# Patient Record
Sex: Female | Born: 1980 | Race: Black or African American | Hispanic: No | Marital: Single | Smoking: Never smoker
Health system: Southern US, Community
[De-identification: ages and names within clinical notes are randomized; demographics above are authoritative.]

## PROBLEM LIST (undated history)

## (undated) ENCOUNTER — Inpatient Hospital Stay (HOSPITAL_COMMUNITY): Payer: Self-pay

## (undated) DIAGNOSIS — R87629 Unspecified abnormal cytological findings in specimens from vagina: Secondary | ICD-10-CM

## (undated) DIAGNOSIS — Z8619 Personal history of other infectious and parasitic diseases: Secondary | ICD-10-CM

## (undated) DIAGNOSIS — D649 Anemia, unspecified: Secondary | ICD-10-CM

## (undated) DIAGNOSIS — Z5189 Encounter for other specified aftercare: Secondary | ICD-10-CM

## (undated) HISTORY — PX: COLPOSCOPY: SHX161

## (undated) HISTORY — PX: ADENOIDECTOMY: SUR15

## (undated) HISTORY — PX: ENDOMETRIAL BIOPSY: SHX622

## (undated) HISTORY — DX: Unspecified abnormal cytological findings in specimens from vagina: R87.629

## (undated) HISTORY — DX: Personal history of other infectious and parasitic diseases: Z86.19

---

## 2004-01-22 ENCOUNTER — Emergency Department (HOSPITAL_COMMUNITY): Admission: EM | Admit: 2004-01-22 | Discharge: 2004-01-22 | Payer: Self-pay | Admitting: Emergency Medicine

## 2005-07-25 ENCOUNTER — Other Ambulatory Visit: Admission: RE | Admit: 2005-07-25 | Discharge: 2005-07-25 | Payer: Self-pay | Admitting: Family Medicine

## 2006-09-04 ENCOUNTER — Other Ambulatory Visit: Admission: RE | Admit: 2006-09-04 | Discharge: 2006-09-04 | Payer: Self-pay | Admitting: Family Medicine

## 2007-12-19 ENCOUNTER — Other Ambulatory Visit: Admission: RE | Admit: 2007-12-19 | Discharge: 2007-12-19 | Payer: Self-pay | Admitting: Family Medicine

## 2009-03-15 ENCOUNTER — Other Ambulatory Visit: Admission: RE | Admit: 2009-03-15 | Discharge: 2009-03-15 | Payer: Self-pay | Admitting: Family Medicine

## 2009-03-17 ENCOUNTER — Encounter: Admission: RE | Admit: 2009-03-17 | Discharge: 2009-03-17 | Payer: Self-pay | Admitting: Family Medicine

## 2010-03-16 ENCOUNTER — Other Ambulatory Visit: Admission: RE | Admit: 2010-03-16 | Discharge: 2010-03-16 | Payer: Self-pay | Admitting: Family Medicine

## 2010-04-18 IMAGING — US US SOFT TISSUE HEAD/NECK
1 series · 14 of 25 positions shown · non-contrast
Comparison: None

CLINICAL DATA: Thyroid goiter

THYROID ULTRASOUND
TECHNIQUE: Ultrasound examination of the thyroid gland and
adjacent soft tissues was performed.

[Series 1: us soft tissue head/neck · 0.06mm/px · 14 of 27 slices shown]
[im 1/27]
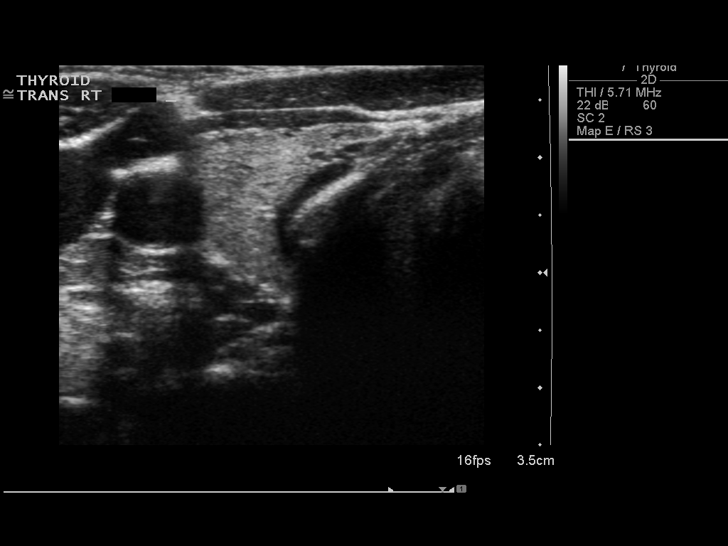
[im 3/27]
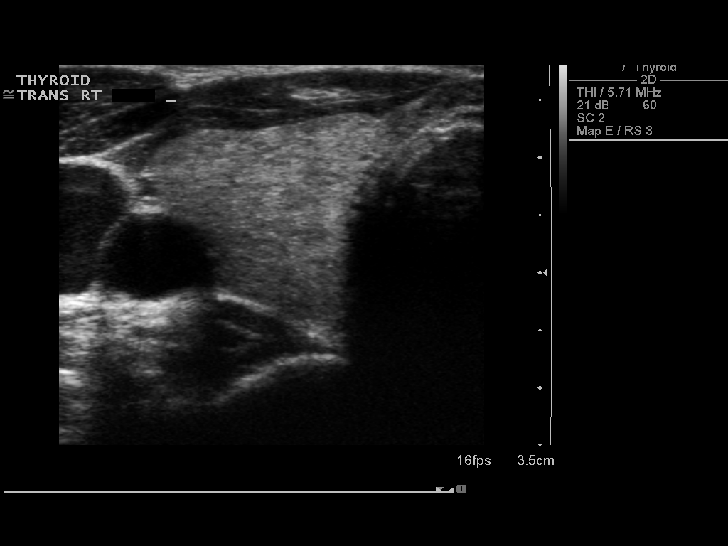
[im 5/27]
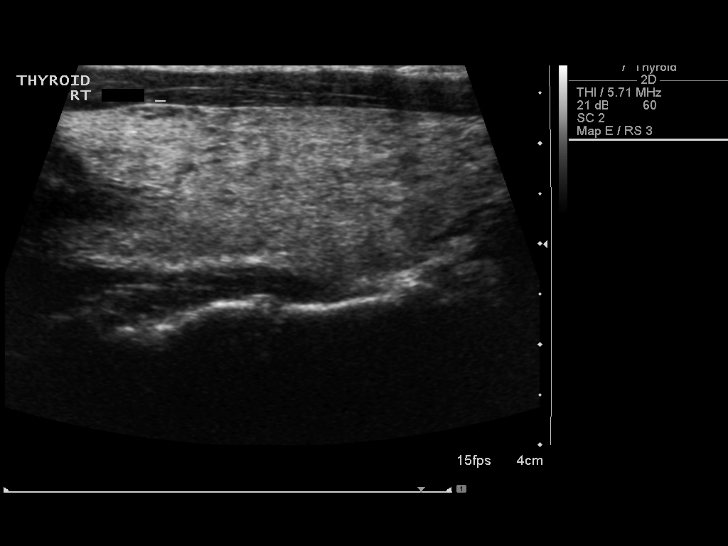
[im 7/27]
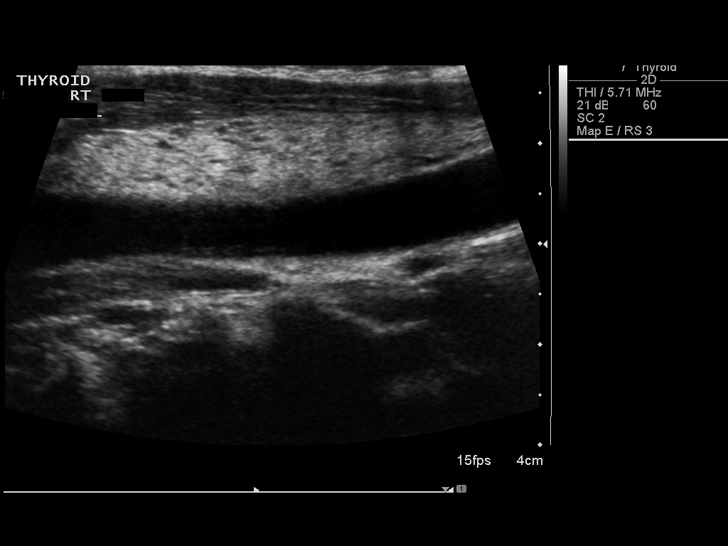
[im 9/27]
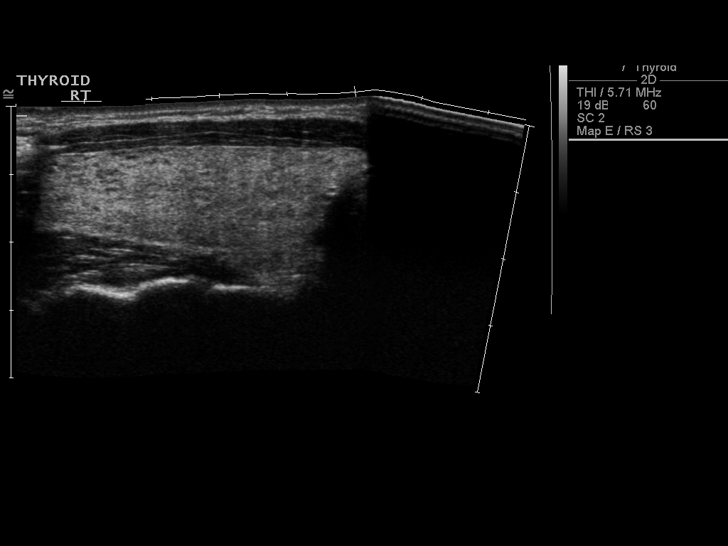
[im 10/27]
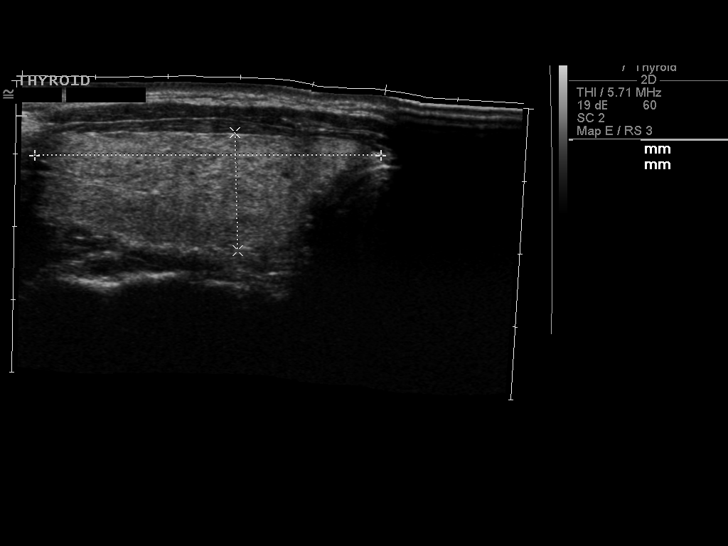
[im 12/27]
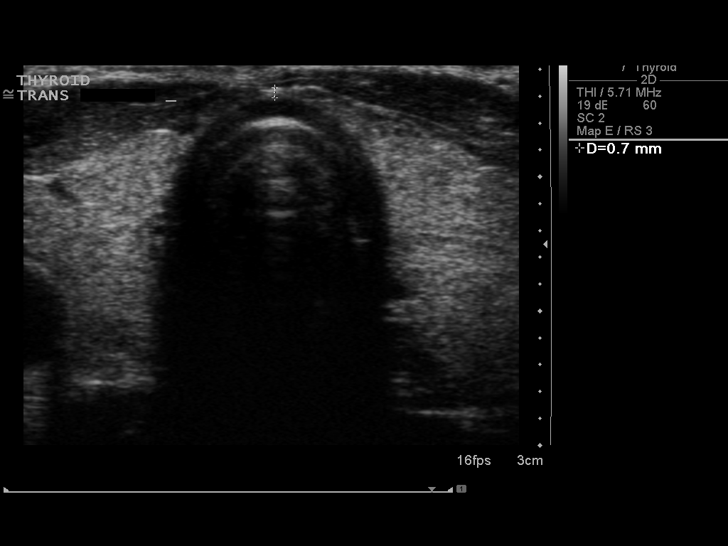
[im 15/27]
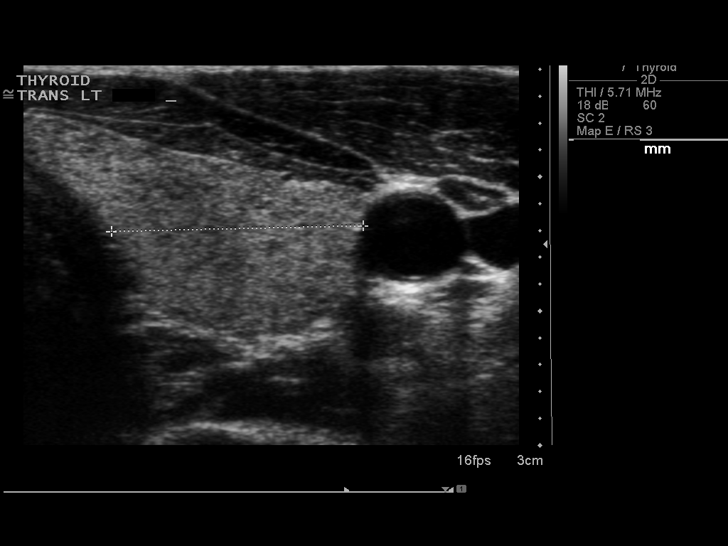
[im 17/27]
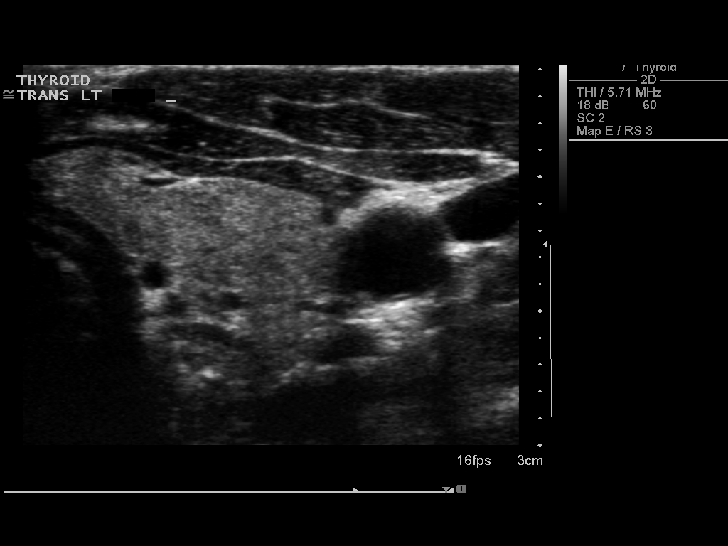
[im 18/27]
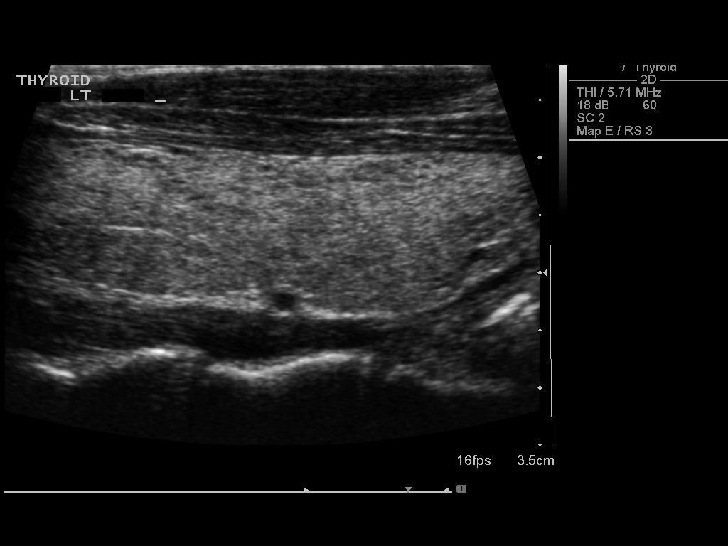
[im 20/27]
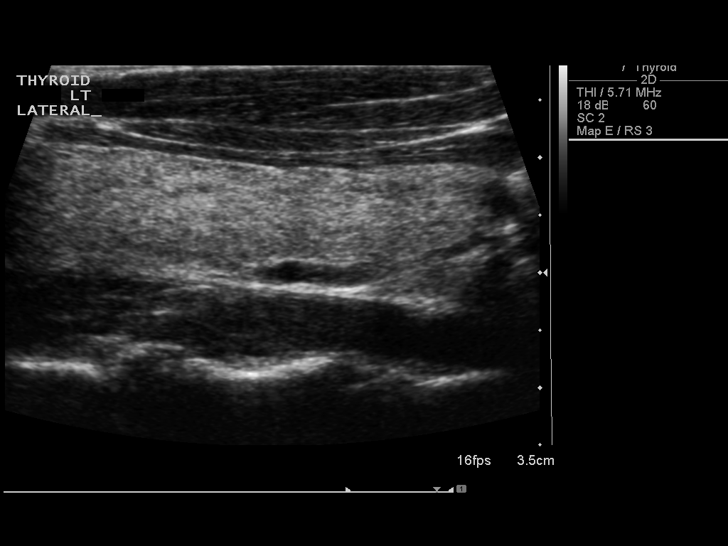
[im 22/27]
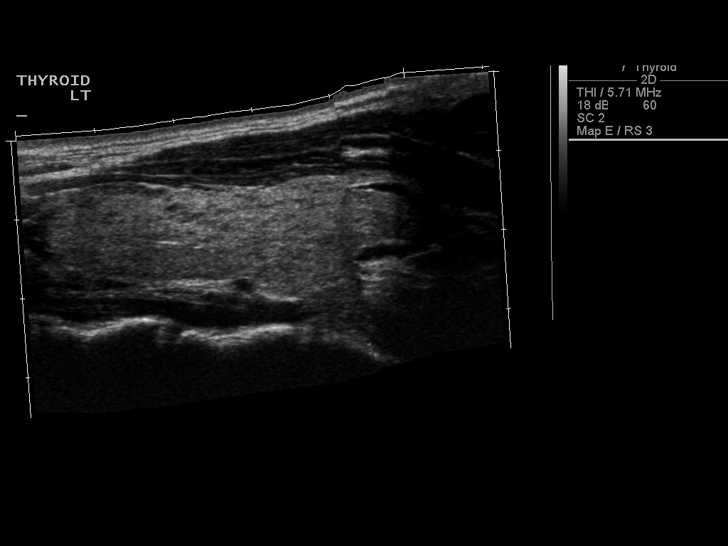
[im 24/27]
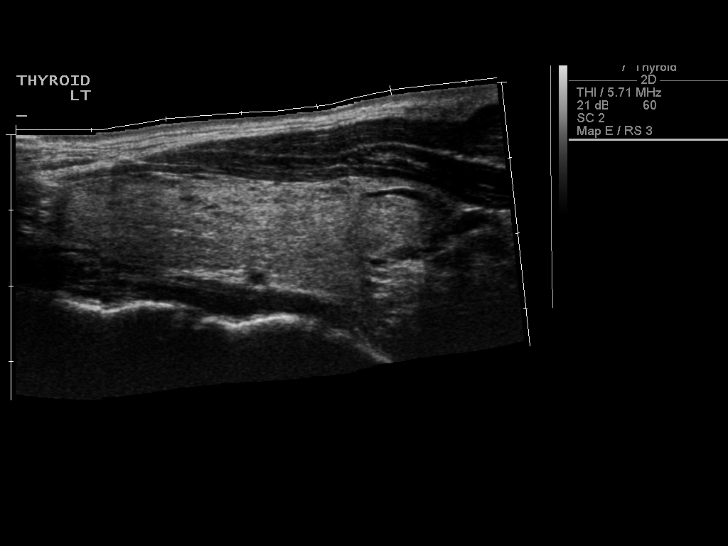
[im 27/27]
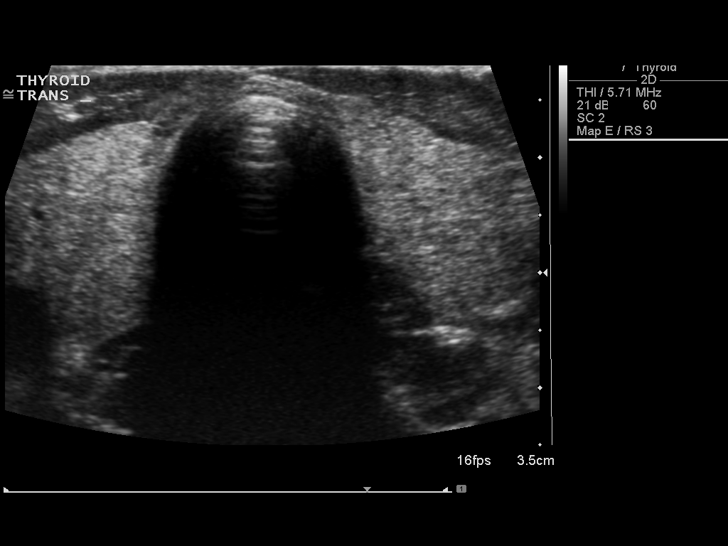

[14 of 25 positions shown; findings below may reference images not displayed]

FINDINGS: The thyroid gland is mildly enlarged and heterogeneous in
echo texture.  No evidence of nodule.

The right lobe of thyroid gland measures 4.9 x 2.0 x 2.8 cm.  The
left lobe of thyroid gland measures 5.3 x 1.5 x 1.8 cm.
IMPRESSION: Mildly enlarged heterogeneous thyroid gland.  Correlate clinically
with thyroiditis and thyroid toxicosis.

## 2011-01-30 DIAGNOSIS — D649 Anemia, unspecified: Secondary | ICD-10-CM | POA: Insufficient documentation

## 2013-12-14 DIAGNOSIS — E538 Deficiency of other specified B group vitamins: Secondary | ICD-10-CM | POA: Insufficient documentation

## 2013-12-14 DIAGNOSIS — N920 Excessive and frequent menstruation with regular cycle: Secondary | ICD-10-CM | POA: Insufficient documentation

## 2017-07-26 DIAGNOSIS — R6882 Decreased libido: Secondary | ICD-10-CM | POA: Insufficient documentation

## 2017-07-26 DIAGNOSIS — B009 Herpesviral infection, unspecified: Secondary | ICD-10-CM | POA: Insufficient documentation

## 2018-03-20 DIAGNOSIS — Z3490 Encounter for supervision of normal pregnancy, unspecified, unspecified trimester: Secondary | ICD-10-CM | POA: Insufficient documentation

## 2018-03-20 LAB — OB RESULTS CONSOLE ANTIBODY SCREEN: Antibody Screen: NEGATIVE

## 2018-03-20 LAB — OB RESULTS CONSOLE HIV ANTIBODY (ROUTINE TESTING): HIV: NONREACTIVE

## 2018-03-20 LAB — OB RESULTS CONSOLE GC/CHLAMYDIA
Chlamydia: NEGATIVE
GC PROBE AMP, GENITAL: NEGATIVE

## 2018-03-20 LAB — OB RESULTS CONSOLE ABO/RH: RH TYPE: POSITIVE

## 2018-03-20 LAB — OB RESULTS CONSOLE RUBELLA ANTIBODY, IGM: Rubella: IMMUNE

## 2018-03-20 LAB — OB RESULTS CONSOLE HEPATITIS B SURFACE ANTIGEN: Hepatitis B Surface Ag: NEGATIVE

## 2018-03-20 LAB — OB RESULTS CONSOLE RPR: RPR: NONREACTIVE

## 2018-09-16 ENCOUNTER — Inpatient Hospital Stay (HOSPITAL_COMMUNITY): Payer: 59

## 2018-09-16 ENCOUNTER — Encounter (HOSPITAL_COMMUNITY): Payer: Self-pay | Admitting: *Deleted

## 2018-09-16 ENCOUNTER — Inpatient Hospital Stay (HOSPITAL_COMMUNITY)
Admission: AD | Admit: 2018-09-16 | Discharge: 2018-09-16 | Disposition: A | Discharge status: Home / Self Care | Payer: 59 | Attending: Obstetrics and Gynecology | Admitting: Obstetrics and Gynecology

## 2018-09-16 DIAGNOSIS — Z3689 Encounter for other specified antenatal screening: Secondary | ICD-10-CM

## 2018-09-16 DIAGNOSIS — O26893 Other specified pregnancy related conditions, third trimester: Secondary | ICD-10-CM | POA: Diagnosis not present

## 2018-09-16 DIAGNOSIS — R0981 Nasal congestion: Secondary | ICD-10-CM | POA: Insufficient documentation

## 2018-09-16 DIAGNOSIS — Z3A34 34 weeks gestation of pregnancy: Secondary | ICD-10-CM | POA: Diagnosis not present

## 2018-09-16 DIAGNOSIS — R062 Wheezing: Secondary | ICD-10-CM

## 2018-09-16 HISTORY — DX: Anemia, unspecified: D64.9

## 2018-09-16 LAB — COMPREHENSIVE METABOLIC PANEL
ALT: 16 U/L (ref 0–44)
AST: 26 U/L (ref 15–41)
Albumin: 3.4 g/dL — ABNORMAL LOW (ref 3.5–5.0)
Alkaline Phosphatase: 85 U/L (ref 38–126)
Anion gap: 9 (ref 5–15)
BUN: 9 mg/dL (ref 6–20)
CO2: 22 mmol/L (ref 22–32)
CREATININE: 0.64 mg/dL (ref 0.44–1.00)
Calcium: 8.7 mg/dL — ABNORMAL LOW (ref 8.9–10.3)
Chloride: 103 mmol/L (ref 98–111)
GFR calc non Af Amer: >60 mL/min (ref >60)
Glucose, Bld: 66 mg/dL — ABNORMAL LOW (ref 70–99)
Potassium: 3.7 mmol/L (ref 3.5–5.1)
SODIUM: 134 mmol/L — AB (ref 135–145)
Total Bilirubin: 0.8 mg/dL (ref 0.3–1.2)
Total Protein: 7.8 g/dL (ref 6.5–8.1)

## 2018-09-16 LAB — CBC WITH DIFFERENTIAL/PLATELET
BASOS ABS: 0 10*3/uL (ref 0.0–0.1)
Basophils Relative: 0 %
EOS ABS: 0.3 10*3/uL (ref 0.0–0.5)
EOS PCT: 3 %
HCT: 43.5 % (ref 36.0–46.0)
Hemoglobin: 14.4 g/dL (ref 12.0–15.0)
Lymphocytes Relative: 22 %
Lymphs Abs: 1.9 10*3/uL (ref 0.7–4.0)
MCH: 31.2 pg (ref 26.0–34.0)
MCHC: 33.1 g/dL (ref 30.0–36.0)
MCV: 94.2 fL (ref 80.0–100.0)
Monocytes Absolute: 0.4 10*3/uL (ref 0.1–1.0)
Monocytes Relative: 5 %
NRBC: 0 % (ref 0.0–0.2)
Neutro Abs: 5.9 10*3/uL (ref 1.7–7.7)
Neutrophils Relative %: 70 %
Platelets: 156 10*3/uL (ref 150–400)
RBC: 4.62 MIL/uL (ref 3.87–5.11)
RDW: 13.8 % (ref 11.5–15.5)
WBC: 8.5 10*3/uL (ref 4.0–10.5)

## 2018-09-16 LAB — URINALYSIS, ROUTINE W REFLEX MICROSCOPIC
BILIRUBIN URINE: NEGATIVE
Glucose, UA: NEGATIVE mg/dL
Hgb urine dipstick: NEGATIVE
Ketones, ur: 5 mg/dL — AB
LEUKOCYTES UA: NEGATIVE
NITRITE: NEGATIVE
PH: 6 (ref 5.0–8.0)
Protein, ur: NEGATIVE mg/dL
Specific Gravity, Urine: 1.015 (ref 1.005–1.030)

## 2018-09-16 MED ORDER — DIPHENHYDRAMINE-APAP (SLEEP) 25-500 MG PO TABS: 1.0000 | ORAL_TABLET | Freq: Every evening | ORAL | 0 refills | Status: DC | PRN

## 2018-09-16 MED ORDER — GUAIFENESIN 100 MG/5ML PO LIQD: 100.0000 mg | ORAL | 0 refills | Status: DC | PRN

## 2018-09-16 MED ORDER — ALBUTEROL SULFATE (2.5 MG/3ML) 0.083% IN NEBU
2.5000 mg | INHALATION_SOLUTION | Freq: Once | RESPIRATORY_TRACT | Status: AC
  Administered 2018-09-16: 2.5 mg via RESPIRATORY_TRACT
  Filled 2018-09-16: qty 3

## 2018-09-16 MED ORDER — SALINE SPRAY 0.65 % NA SOLN: 1.0000 | NASAL | 0 refills | Status: DC | PRN

## 2018-09-16 MED ORDER — LORATADINE 10 MG PO TABS: 10.0000 mg | ORAL_TABLET | Freq: Every day | ORAL | 0 refills | Status: DC

## 2018-09-16 NOTE — Discharge Instructions (Signed)

## 2018-09-16 NOTE — MAU Note (Signed)
Pt sent from MD office,C/O cough for the last 3 weeks, feeling SOB, congestion, also some wheezing.  Denies hx of asthma.  Denies contractions, bleeding or LOF.  Reports good fetal movement.

## 2018-09-16 NOTE — MAU Provider Note (Signed)
History     CSN: [id number]  Arrival date and time: 09/16/18 1708   First Provider Initiated Contact with Patient 09/16/18 1736      Chief Complaint  Patient presents with  . Cough  . Shortness of Breath  . Fatigue   HPI Colleen Fisher is a [age] y.o. G1P0 at 8317w5d who presents to MAU with chief complaint of sinus congestion and intermittent SOB. This is a recurring problem which waxes and wanes, onset two weeks ago. Patient states she has had short-term success with OTC medication including saline nasal spray, Mucinex, Delsym, and Vix Vaporub but has not consistently used them or combined them for management throughout the day.   Patient denies vaginal bleeding, leaking of fluid, decreased fetal movement, fever, falls, or recent illness.    Patient verbalizes concern for DVT. States her niece told her her symptoms matched those of DVT and they are both concerned.   OB History    Gravida  1   Para      Term      Preterm      AB      Living        SAB      TAB      Ectopic      Multiple      Live Births              Past Medical History:  Diagnosis Date  . Anemia     Past Surgical History:  Procedure Laterality Date  . ADENOIDECTOMY      No family history on file.  Social History   Tobacco Use  . Smoking status: Never Smoker  . Smokeless tobacco: Never Used  Substance Use Topics  . Alcohol use: Never    Frequency: Never  . Drug use: Never    Allergies: No Known Allergies  No medications prior to admission.    Review of Systems  Constitutional: Positive for appetite change and fatigue. Negative for chills and fever.  HENT: Positive for congestion and sinus pressure. Negative for ear pain, facial swelling, nosebleeds, postnasal drip, sinus pain, sneezing, sore throat, trouble swallowing and voice change.   Eyes: Negative for discharge.  Respiratory: Positive for cough and shortness of breath. Negative for chest tightness.    Gastrointestinal: Negative for abdominal pain, nausea and vomiting.  Genitourinary: Negative for difficulty urinating, flank pain, vaginal bleeding, vaginal discharge and vaginal pain.  Musculoskeletal: Negative for back pain.  All other systems reviewed and are negative.  Physical Exam   Blood pressure 119/74, pulse 85, temperature 98.4 F (36.9 C), temperature source Oral, resp. rate 18, height 5' 3.5" (1.613 m), weight 83 kg, SpO2 99 %.  Physical Exam  Nursing note and vitals reviewed. Constitutional: She is oriented to person, place, and time. She appears well-developed and well-nourished.  Cardiovascular: Normal rate, normal heart sounds and intact distal pulses.  Respiratory: Effort normal and breath sounds normal. No tachypnea. No respiratory distress. She has no decreased breath sounds. She has no wheezes. She has no rales. She exhibits no tenderness, no crepitus and no retraction.  GI: Soft. She exhibits no distension and no mass. There is no tenderness. There is no rebound and no guarding.  Neurological: She is alert and oriented to person, place, and time.  Skin: Skin is warm and dry.  Psychiatric: She has a normal mood and affect. Her behavior is normal. Judgment and thought content normal.    MAU Course/MDM    --Afebrile, no  WBC elevation or left shift --Reactive fetal tracing: baseline 135, moderate variability, positive accelerations, no decelerations --Toco: irregular contractions q 2-3 min, not felt by patient, palpate mild --Cervix remains closed/thick/posterior --Greater than 30 minutes spent discussing physical exam, lab results and creating a regimen for symptom management with patient --Discussed hallmarks of DVT, provided comparison to patient's physical exam --Patient ambulating and repositioning without difficulty, not coughing, not dyspneic, not SOB upon arrival or at any time during evaluation in MAU.   Patient Vitals for the past 24 hrs:  BP Temp Temp  src Pulse Resp SpO2 Height Weight  09/16/18 1920 104/71 - - 81 - - - -  09/16/18 1745 - - - - - 99 % - -  09/16/18 1740 - - - - - 99 % - -  09/16/18 1735 - - - 85 - 99 % - -  09/16/18 1722 119/74 98.4 F (36.9 C) Oral 78 18 100 % 5' 3.5" (1.613 m) 83 kg    Results for orders placed or performed during the hospital encounter of 09/16/18 (from the past 24 hour(s))  CBC with Differential/Platelet     Status: None   Collection Time: 09/16/18  5:49 PM  Result Value Ref Range   WBC 8.5 4.0 - 10.5 K/uL   RBC 4.62 3.87 - 5.11 MIL/uL   Hemoglobin 14.4 12.0 - 15.0 g/dL   HCT 16.1 09.6 - 04.5 %   MCV 94.2 80.0 - 100.0 fL   MCH 31.2 26.0 - 34.0 pg   MCHC 33.1 30.0 - 36.0 g/dL   RDW 40.9 81.1 - 91.4 %   Platelets 156 150 - 400 K/uL   nRBC 0.0 0.0 - 0.2 %   Neutrophils Relative % 70 %   Neutro Abs 5.9 1.7 - 7.7 K/uL   Lymphocytes Relative 22 %   Lymphs Abs 1.9 0.7 - 4.0 K/uL   Monocytes Relative 5 %   Monocytes Absolute 0.4 0.1 - 1.0 K/uL   Eosinophils Relative 3 %   Eosinophils Absolute 0.3 0.0 - 0.5 K/uL   Basophils Relative 0 %   Basophils Absolute 0.0 0.0 - 0.1 K/uL  Comprehensive metabolic panel     Status: Abnormal   Collection Time: 09/16/18  5:49 PM  Result Value Ref Range   Sodium 134 (L) 135 - 145 mmol/L   Potassium 3.7 3.5 - 5.1 mmol/L   Chloride 103 98 - 111 mmol/L   CO2 22 22 - 32 mmol/L   Glucose, Bld 66 (L) 70 - 99 mg/dL   BUN 9 6 - 20 mg/dL   Creatinine, Ser 7.82 0.44 - 1.00 mg/dL   Calcium 8.7 (L) 8.9 - 10.3 mg/dL   Total Protein 7.8 6.5 - 8.1 g/dL   Albumin 3.4 (L) 3.5 - 5.0 g/dL   AST 26 15 - 41 U/L   ALT 16 0 - 44 U/L   Alkaline Phosphatase 85 38 - 126 U/L   Total Bilirubin 0.8 0.3 - 1.2 mg/dL   GFR calc non Af Amer >60 >60 mL/min   GFR calc Af Amer >60 >60 mL/min   Anion gap 9 5 - 15  Urinalysis, Routine w reflex microscopic     Status: Abnormal   Collection Time: 09/16/18  6:15 PM  Result Value Ref Range   Color, Urine YELLOW YELLOW   APPearance CLEAR  CLEAR   Specific Gravity, Urine 1.015 1.005 - 1.030   pH 6.0 5.0 - 8.0   Glucose, UA NEGATIVE NEGATIVE mg/dL  Hgb urine dipstick NEGATIVE NEGATIVE   Bilirubin Urine NEGATIVE NEGATIVE   Ketones, ur 5 (A) NEGATIVE mg/dL   Protein, ur NEGATIVE NEGATIVE mg/dL   Nitrite NEGATIVE NEGATIVE   Leukocytes, UA NEGATIVE NEGATIVE    Dg Chest 2 View  Result Date: 09/16/2018 CLINICAL DATA:  Cough for 3 weeks.  Thirty-four weeks pregnant. EXAM: CHEST - 2 VIEW COMPARISON:  None. FINDINGS: Cardiomediastinal silhouette is normal. No pleural effusions or focal consolidations. Linear densities posterior costophrenic angles. Trachea projects midline and there is no pneumothorax. Soft tissue planes and included osseous structures are non-suspicious. Abdominal shield in place. IMPRESSION: 1. Posterior lung base atelectasis versus confluence of osseous shadows. No focal consolidation. Electronically Signed   By: Awilda Metro M.D.   On: 09/16/2018 18:44    Meds ordered this encounter  Medications  . albuterol (PROVENTIL) (2.5 MG/3ML) 0.083% nebulizer solution 2.5 mg  . sodium chloride (OCEAN) 0.65 % SOLN nasal spray    Sig: Place 1 spray into both nostrils as needed for congestion.    Dispense:  1 Bottle    Refill:  0    Order Specific Question:   Supervising Provider    Answer:   Reva Bores [[id number]]  . loratadine (CLARITIN) 10 MG tablet    Sig: Take 1 tablet (10 mg total) by mouth daily.    Dispense:  30 tablet    Refill:  0    Order Specific Question:   Supervising Provider    Answer:   Reva Bores [[id number]]  . guaiFENesin (ROBITUSSIN) 100 MG/5ML liquid    Sig: Take 5-10 mLs (100-200 mg total) by mouth every 4 (four) hours as needed for cough.    Dispense:  60 mL    Refill:  0    Order Specific Question:   Supervising Provider    Answer:   Reva Bores [[id number]]  . diphenhydramine-acetaminophen (TYLENOL PM EXTRA STRENGTH) 25-500 MG TABS tablet    Sig: Take 1 tablet by mouth at bedtime as needed.     Dispense:  14 tablet    Refill:  0    Order Specific Question:   Supervising Provider    Answer:   Reva Bores [[id number]]    Assessment and Plan  --37 y.o. G1P0 at [redacted]w[redacted]d --Reactive fetal tracing, closed cervix --Sinus congestion, continue symptom management --Discharge home in stable condition  F/U: Patient has OB appt 12/20  Calvert Cantor, CNM 09/16/2018, 7:32 PM

## 2018-10-02 LAB — OB RESULTS CONSOLE GBS: GBS: POSITIVE

## 2018-10-08 NOTE — L&D Delivery Note (Signed)
Delivery Note Pt pushed for about an hour, very well for delivery.  At 9:21 PM a viable and healthy female was delivered via Vaginal, Spontaneous (Presentation: OA; LOT ).  APGAR: 8, 9 ; weight  .   Placenta status: delivered, intact .  Cord: 3V with the following complications: none.    Anesthesia:  epidural Episiotomy: None Lacerations: 2nd degree Suture Repair: 3.0 vicryl rapide Est. Blood Loss (mL): 3250cc  During pushing pt with some blood loss - 433cc Postpartum hemorrhage from atony - after delivery weighed blood clots, sponges,add pre delivery blood loss.  Given TXA and rectal cytotec.    Mom to postpartum.  Baby to Couplet care / Skin to Skin.  Colleen Fisher 10/15/2018, 9:59 PM  Br/Bo, O+. [place], did not receive Tdap in [facility], contra ?  Pr desires circumcision for female infant, d/w pt r/b/a

## 2018-10-13 ENCOUNTER — Encounter (HOSPITAL_COMMUNITY): Payer: Self-pay

## 2018-10-13 ENCOUNTER — Inpatient Hospital Stay (EMERGENCY_DEPARTMENT_HOSPITAL)
Admission: AD | Admit: 2018-10-13 | Discharge: 2018-10-13 | Disposition: A | Discharge status: Home / Self Care | Payer: 59 | Source: Ambulatory Visit | Attending: Obstetrics and Gynecology | Admitting: Obstetrics and Gynecology

## 2018-10-13 ENCOUNTER — Inpatient Hospital Stay (HOSPITAL_BASED_OUTPATIENT_CLINIC_OR_DEPARTMENT_OTHER): Payer: 59

## 2018-10-13 ENCOUNTER — Other Ambulatory Visit: Payer: Self-pay

## 2018-10-13 DIAGNOSIS — O99824 Streptococcus B carrier state complicating childbirth: Secondary | ICD-10-CM | POA: Diagnosis not present

## 2018-10-13 DIAGNOSIS — O26893 Other specified pregnancy related conditions, third trimester: Secondary | ICD-10-CM

## 2018-10-13 DIAGNOSIS — Z3689 Encounter for other specified antenatal screening: Secondary | ICD-10-CM

## 2018-10-13 DIAGNOSIS — Z3483 Encounter for supervision of other normal pregnancy, third trimester: Secondary | ICD-10-CM | POA: Diagnosis not present

## 2018-10-13 DIAGNOSIS — Z3A38 38 weeks gestation of pregnancy: Secondary | ICD-10-CM

## 2018-10-13 DIAGNOSIS — Z0371 Encounter for suspected problem with amniotic cavity and membrane ruled out: Secondary | ICD-10-CM | POA: Diagnosis not present

## 2018-10-13 DIAGNOSIS — O471 False labor at or after 37 completed weeks of gestation: Secondary | ICD-10-CM | POA: Diagnosis not present

## 2018-10-13 DIAGNOSIS — O4693 Antepartum hemorrhage, unspecified, third trimester: Secondary | ICD-10-CM

## 2018-10-13 DIAGNOSIS — O4690 Antepartum hemorrhage, unspecified, unspecified trimester: Secondary | ICD-10-CM

## 2018-10-13 HISTORY — DX: Encounter for other specified aftercare: Z51.89

## 2018-10-13 LAB — URINALYSIS, ROUTINE W REFLEX MICROSCOPIC
Bilirubin Urine: NEGATIVE
GLUCOSE, UA: NEGATIVE mg/dL
Hgb urine dipstick: NEGATIVE
KETONES UR: NEGATIVE mg/dL
LEUKOCYTES UA: NEGATIVE
NITRITE: NEGATIVE
PH: 6 (ref 5.0–8.0)
Protein, ur: NEGATIVE mg/dL
SPECIFIC GRAVITY, URINE: 1.016 (ref 1.005–1.030)

## 2018-10-13 NOTE — MAU Provider Note (Signed)
History     CSN: [id number]  Arrival date and time: 10/13/18 1858   First Provider Initiated Contact with Patient 10/13/18 1942      Chief Complaint  Patient presents with  . Vaginal Bleeding   37 y.o. G1 @38 .4 wks presenting with spotting and pelvic pressure. Reports seeing red on the toilet tissue and her underwear around 5:30 tonight. Reports occasional abd cramping and pelvic pressure. No recent IC. No LOF or ctx. Reports good FM. Her pregnancy has been complicated by anemia, GBS, and chronic diarrhea.   OB History    Gravida  1   Para      Term      Preterm      AB      Living        SAB      TAB      Ectopic      Multiple      Live Births              Past Medical History:  Diagnosis Date  . Anemia   . Blood transfusion without reported diagnosis     Past Surgical History:  Procedure Laterality Date  . ADENOIDECTOMY      No family history on file.  Social History   Tobacco Use  . Smoking status: Never Smoker  . Smokeless tobacco: Never Used  Substance Use Topics  . Alcohol use: Never    Frequency: Never  . Drug use: Never    Allergies: No Known Allergies  Medications Prior to Admission  Medication Sig Dispense Refill Last Dose  . diphenhydramine-acetaminophen (TYLENOL PM EXTRA STRENGTH) 25-500 MG TABS tablet Take 1 tablet by mouth at bedtime as needed. 14 tablet 0   . guaiFENesin (ROBITUSSIN) 100 MG/5ML liquid Take 5-10 mLs (100-200 mg total) by mouth every 4 (four) hours as needed for cough. 60 mL 0   . loratadine (CLARITIN) 10 MG tablet Take 1 tablet (10 mg total) by mouth daily. 30 tablet 0   . sodium chloride (OCEAN) 0.65 % SOLN nasal spray Place 1 spray into both nostrils as needed for congestion. 1 Bottle 0     Review of Systems  Gastrointestinal: Positive for abdominal pain and diarrhea.  Genitourinary: Positive for vaginal bleeding.   Physical Exam   Blood pressure 125/79, pulse 90, temperature 98.1 F (36.7 C),  temperature source Oral, resp. rate 18, weight 83.8 kg.  Physical Exam  Constitutional: She is oriented to person, place, and time. She appears well-developed and well-nourished. No distress.  HENT:  Head: Normocephalic and atraumatic.  Neck: Normal range of motion.  Cardiovascular: Normal rate.  Respiratory: Effort normal. No respiratory distress.  GI: Soft. She exhibits no distension. There is no abdominal tenderness.  gravid  Genitourinary:    Genitourinary Comments: Speculum: scant red blood VE: 1/60/-3 per RN   Musculoskeletal: Normal range of motion.  Neurological: She is alert and oriented to person, place, and time.  Skin: Skin is warm and dry.  Psychiatric: She has a normal mood and affect.  EFM: 120 bpm, mod variability, + accels, no decels Toco: irritability  Results for orders placed or performed during the hospital encounter of 10/13/18 (from the past 24 hour(s))  Urinalysis, Routine w reflex microscopic     Status: None   Collection Time: 10/13/18  7:26 PM  Result Value Ref Range   Color, Urine YELLOW YELLOW   APPearance CLEAR CLEAR   Specific Gravity, Urine 1.016 1.005 - 1.030  pH 6.0 5.0 - 8.0   Glucose, UA NEGATIVE NEGATIVE mg/dL   Hgb urine dipstick NEGATIVE NEGATIVE   Bilirubin Urine NEGATIVE NEGATIVE   Ketones, ur NEGATIVE NEGATIVE mg/dL   Protein, ur NEGATIVE NEGATIVE mg/dL   Nitrite NEGATIVE NEGATIVE   Leukocytes, UA NEGATIVE NEGATIVE    MAU Course  Procedures  MDM [place] ordered. No evidence of abruption or previa. No signs of labor. Stable for discharge home.  Assessment and Plan   1. [redacted] weeks gestation of pregnancy   2. Vaginal bleeding in pregnancy   3. NST (non-stress test) reactive    Discharge home Follow up at [facility] in 4 days [facility] Labor precautions Bleeding precautions  Allergies as of 10/13/2018   No Known Allergies     Medication List    STOP taking these medications   diphenhydramine-acetaminophen 25-500 MG Tabs  tablet Commonly known as:  TYLENOL PM EXTRA STRENGTH   guaiFENesin 100 MG/5ML liquid Commonly known as:  ROBITUSSIN   loratadine 10 MG tablet Commonly known as:  CLARITIN   sodium chloride 0.65 % Soln nasal spray Commonly known as:  Jaci Lazier, CNM 10/13/2018, 9:05 PM

## 2018-10-13 NOTE — Discharge Instructions (Signed)
Fetal Movement Counts Patient Name: ________________________________________________ Patient Due Date: ____________________ What is a fetal movement count?  A fetal movement count is the number of times that you feel your baby move during a certain amount of time. This may also be called a fetal kick count. A fetal movement count is recommended for every pregnant woman. You may be asked to start counting fetal movements as early as week 28 of your pregnancy. Pay attention to when your baby is most active. You may notice your baby's sleep and wake cycles. You may also notice things that make your baby move more. You should do a fetal movement count:  When your baby is normally most active.  At the same time each day. A good time to count movements is while you are resting, after having something to eat and drink. How do I count fetal movements? 1. Find a quiet, comfortable area. Sit, or lie down on your side. 2. Write down the date, the start time and stop time, and the number of movements that you felt between those two times. Take this information with you to your health care visits. 3. For 2 hours, count kicks, flutters, swishes, rolls, and jabs. You should feel at least 10 movements during 2 hours. 4. You may stop counting after you have felt 10 movements. 5. If you do not feel 10 movements in 2 hours, have something to eat and drink. Then, keep resting and counting for 1 hour. If you feel at least 4 movements during that hour, you may stop counting. Contact a health care provider if:  You feel fewer than 4 movements in 2 hours.  Your baby is not moving like he or she usually does. Date: ____________ Start time: ____________ Stop time: ____________ Movements: ____________ Date: ____________ Start time: ____________ Stop time: ____________ Movements: ____________ Date: ____________ Start time: ____________ Stop time: ____________ Movements: ____________ Date: ____________ Start time:  ____________ Stop time: ____________ Movements: ____________ Date: ____________ Start time: ____________ Stop time: ____________ Movements: ____________ Date: ____________ Start time: ____________ Stop time: ____________ Movements: ____________ Date: ____________ Start time: ____________ Stop time: ____________ Movements: ____________ Date: ____________ Start time: ____________ Stop time: ____________ Movements: ____________ Date: ____________ Start time: ____________ Stop time: ____________ Movements: ____________ This information is not intended to replace advice given to you by your health care provider. Make sure you discuss any questions you have with your health care provider. Document Released: 10/24/2006 Document Revised: 05/23/2016 Document Reviewed: 11/03/2015 Elsevier Interactive Patient Education  2019 [employer] Braxton Hicks Contractions Contractions of the uterus can occur throughout pregnancy, but they are not always a sign that you are in labor. You may have practice contractions called Braxton Hicks contractions. These false labor contractions are sometimes confused with true labor. What are Braxton Hicks contractions? Braxton Hicks contractions are tightening movements that occur in the muscles of the uterus before labor. Unlike true labor contractions, these contractions do not result in opening (dilation) and thinning of the cervix. Toward the end of pregnancy (32-34 weeks), Braxton Hicks contractions can happen more often and may become stronger. These contractions are sometimes difficult to tell apart from true labor because they can be very uncomfortable. You should not feel embarrassed if you go to the hospital with false labor. Sometimes, the only way to tell if you are in true labor is for your health care provider to look for changes in the cervix. The health care provider will do a physical exam and may monitor your contractions. If   you are not in true labor, the exam  should show that your cervix is not dilating and your water has not broken. If there are no other health problems associated with your pregnancy, it is completely safe for you to be sent home with false labor. You may continue to have Braxton Hicks contractions until you go into true labor. How to tell the difference between true labor and false labor True labor  Contractions last 30-70 seconds.  Contractions become very regular.  Discomfort is usually felt in the top of the uterus, and it spreads to the lower abdomen and low back.  Contractions do not go away with walking.  Contractions usually become more intense and increase in frequency.  The cervix dilates and gets thinner. False labor  Contractions are usually shorter and not as strong as true labor contractions.  Contractions are usually irregular.  Contractions are often felt in the front of the lower abdomen and in the groin.  Contractions may go away when you walk around or change positions while lying down.  Contractions get weaker and are shorter-lasting as time goes on.  The cervix usually does not dilate or become thin. Follow these instructions at home:   Take over-the-counter and prescription medicines only as told by your health care provider.  Keep up with your usual exercises and follow other instructions from your health care provider.  Eat and drink lightly if you think you are going into labor.  If Braxton Hicks contractions are making you uncomfortable: ? Change your position from lying down or resting to walking, or change from walking to resting. ? Sit and rest in a tub of warm water. ? Drink enough fluid to keep your urine pale yellow. Dehydration may cause these contractions. ? Do slow and deep breathing several times an hour.  Keep all follow-up prenatal visits as told by your health care provider. This is important. Contact a health care provider if:  You have a fever.  You have continuous  pain in your abdomen. Get help right away if:  Your contractions become stronger, more regular, and closer together.  You have fluid leaking or gushing from your vagina.  You pass blood-tinged mucus (bloody show).  You have bleeding from your vagina.  You have low back pain that you never had before.  You feel your baby's head pushing down and causing pelvic pressure.  Your baby is not moving inside you as much as it used to. Summary  Contractions that occur before labor are called Braxton Hicks contractions, false labor, or practice contractions.  Braxton Hicks contractions are usually shorter, weaker, farther apart, and less regular than true labor contractions. True labor contractions usually become progressively stronger and regular, and they become more frequent.  Manage discomfort from Braxton Hicks contractions by changing position, resting in a warm bath, drinking plenty of water, or practicing deep breathing. This information is not intended to replace advice given to you by your health care provider. Make sure you discuss any questions you have with your health care provider. Document Released: 02/07/2017 Document Revised: 07/09/2017 Document Reviewed: 02/07/2017 Elsevier Interactive Patient Education  2019 [employer] Inc[employer]  

## 2018-10-13 NOTE — MAU Note (Signed)
Urine in lab 

## 2018-10-13 NOTE — MAU Note (Signed)
Pt presents to MAU c/o vaginal bleeding when she wiped pt states it was bright red. Pt reports occasional abdominal cramping and pressure. +FM. Diarrhea today.

## 2018-10-14 ENCOUNTER — Other Ambulatory Visit: Payer: Self-pay

## 2018-10-14 ENCOUNTER — Encounter (HOSPITAL_COMMUNITY): Payer: Self-pay | Admitting: *Deleted

## 2018-10-14 ENCOUNTER — Inpatient Hospital Stay (EMERGENCY_DEPARTMENT_HOSPITAL)
Admission: AD | Admit: 2018-10-14 | Discharge: 2018-10-15 | Disposition: A | Discharge status: Home / Self Care | Payer: 59 | Source: Home / Self Care | Attending: Obstetrics and Gynecology | Admitting: Obstetrics and Gynecology

## 2018-10-14 DIAGNOSIS — Z3A38 38 weeks gestation of pregnancy: Secondary | ICD-10-CM

## 2018-10-14 DIAGNOSIS — O26893 Other specified pregnancy related conditions, third trimester: Secondary | ICD-10-CM | POA: Insufficient documentation

## 2018-10-14 DIAGNOSIS — Z0371 Encounter for suspected problem with amniotic cavity and membrane ruled out: Secondary | ICD-10-CM

## 2018-10-14 NOTE — MAU Note (Signed)
Pt reports to MAU c/o possible SROM @2205  clear fluid with bloody show. Pt reports pressure in her lower abdomen and vagina. +FM.

## 2018-10-14 NOTE — MAU Note (Signed)
DOES NOT FEEL ANYMORE FLUID  COME OUT - HAS HAD BLOODY SHOW TODAY. Marland Kitchen FEELS PRESSURE  WITH UC'S

## 2018-10-14 NOTE — MAU Note (Signed)
PT SAYS SHE WAS HERE ON Monday- FOR BLOODY SHOW. - VE 1 CM.  THEN  TONIGHT WENT TO B-ROOM AT 10PM- SHE VOIDED.   GOT UP  THEN HAD A TRICKLE DOWN HER LEG . DID NOT GET  IN SHOWER.  CALLED DR  OFFICE -  [facility] TOLD TO COME  IN .

## 2018-10-15 ENCOUNTER — Inpatient Hospital Stay (HOSPITAL_COMMUNITY)
Admission: AD | Admit: 2018-10-15 | Discharge: 2018-10-17 | DRG: 806 | Disposition: A | Discharge status: Home / Self Care | Payer: 59 | Attending: Obstetrics and Gynecology | Admitting: Obstetrics and Gynecology

## 2018-10-15 ENCOUNTER — Telehealth (HOSPITAL_COMMUNITY): Payer: Self-pay | Admitting: *Deleted

## 2018-10-15 ENCOUNTER — Encounter (HOSPITAL_COMMUNITY): Payer: Self-pay | Admitting: *Deleted

## 2018-10-15 ENCOUNTER — Inpatient Hospital Stay (HOSPITAL_COMMUNITY): Payer: 59 | Admitting: Anesthesiology

## 2018-10-15 DIAGNOSIS — A6000 Herpesviral infection of urogenital system, unspecified: Secondary | ICD-10-CM | POA: Diagnosis present

## 2018-10-15 DIAGNOSIS — Z3A38 38 weeks gestation of pregnancy: Secondary | ICD-10-CM | POA: Diagnosis not present

## 2018-10-15 DIAGNOSIS — O9832 Other infections with a predominantly sexual mode of transmission complicating childbirth: Secondary | ICD-10-CM | POA: Diagnosis present

## 2018-10-15 DIAGNOSIS — O471 False labor at or after 37 completed weeks of gestation: Secondary | ICD-10-CM | POA: Diagnosis not present

## 2018-10-15 DIAGNOSIS — O99824 Streptococcus B carrier state complicating childbirth: Principal | ICD-10-CM | POA: Diagnosis present

## 2018-10-15 DIAGNOSIS — Z0371 Encounter for suspected problem with amniotic cavity and membrane ruled out: Secondary | ICD-10-CM | POA: Diagnosis not present

## 2018-10-15 DIAGNOSIS — Z3483 Encounter for supervision of other normal pregnancy, third trimester: Secondary | ICD-10-CM | POA: Diagnosis present

## 2018-10-15 LAB — CBC
HCT: 45.2 % (ref 36.0–46.0)
HCT: 38.8 % (ref 36.0–46.0)
Hemoglobin: 14.8 g/dL (ref 12.0–15.0)
Hemoglobin: 12.2 g/dL (ref 12.0–15.0)
MCH: 31 pg (ref 26.0–34.0)
MCH: 30 pg (ref 26.0–34.0)
MCHC: 32.7 g/dL (ref 30.0–36.0)
MCHC: 31.4 g/dL (ref 30.0–36.0)
MCV: 94.6 fL (ref 80.0–100.0)
MCV: 95.3 fL (ref 80.0–100.0)
Platelets: 135 10*3/uL — ABNORMAL LOW (ref 150–400)
Platelets: 166 10*3/uL (ref 150–400)
RBC: 4.78 MIL/uL (ref 3.87–5.11)
RBC: 4.07 MIL/uL (ref 3.87–5.11)
RDW: 13.6 % (ref 11.5–15.5)
RDW: 13.7 % (ref 11.5–15.5)
WBC: 11.8 10*3/uL — ABNORMAL HIGH (ref 4.0–10.5)
WBC: 18.9 10*3/uL — ABNORMAL HIGH (ref 4.0–10.5)
nRBC: 0 % (ref 0.0–0.2)
nRBC: 0 % (ref 0.0–0.2)

## 2018-10-15 LAB — AMNISURE RUPTURE OF MEMBRANE (ROM) NOT AT ARMC: Amnisure ROM: NEGATIVE

## 2018-10-15 LAB — TYPE AND SCREEN
ABO/RH(D): O POS
Antibody Screen: NEGATIVE

## 2018-10-15 LAB — ABO/RH: ABO/RH(D): O POS

## 2018-10-15 MED ORDER — LACTATED RINGERS IV SOLN
500.0000 mL | Freq: Once | INTRAVENOUS | Status: AC
  Administered 2018-10-15: 500 mL via INTRAVENOUS

## 2018-10-15 MED ORDER — ONDANSETRON HCL 4 MG/2ML IJ SOLN: 4.0000 mg | Freq: Four times a day (QID) | INTRAMUSCULAR | Status: DC | PRN

## 2018-10-15 MED ORDER — LACTATED RINGERS IV SOLN
INTRAVENOUS | Status: DC
  Administered 2018-10-15 (×2): via INTRAVENOUS
  Administered 2018-10-15: 125 mL/h via INTRAVENOUS

## 2018-10-15 MED ORDER — LIDOCAINE HCL (PF) 1 % IJ SOLN
30.0000 mL | INTRAMUSCULAR | Status: DC | PRN
  Filled 2018-10-15: qty 30

## 2018-10-15 MED ORDER — BUTORPHANOL TARTRATE 1 MG/ML IJ SOLN
1.0000 mg | INTRAMUSCULAR | Status: DC | PRN
  Administered 2018-10-15: 1 mg via INTRAVENOUS
  Filled 2018-10-15: qty 1

## 2018-10-15 MED ORDER — PENICILLIN G 3 MILLION UNITS IVPB - SIMPLE MED
3.0000 10*6.[IU] | INTRAVENOUS | Status: DC
  Filled 2018-10-15 (×4): qty 100

## 2018-10-15 MED ORDER — LACTATED RINGERS IV BOLUS
1000.0000 mL | Freq: Once | INTRAVENOUS | Status: AC
  Administered 2018-10-15: 1000 mL via INTRAVENOUS

## 2018-10-15 MED ORDER — FENTANYL 2.5 MCG/ML BUPIVACAINE 1/10 % EPIDURAL INFUSION (WH - ANES)
14.0000 mL/h | INTRAMUSCULAR | Status: DC | PRN
  Administered 2018-10-15 (×2): 14 mL/h via EPIDURAL
  Filled 2018-10-15 (×2): qty 100

## 2018-10-15 MED ORDER — PHENYLEPHRINE 40 MCG/ML (10ML) SYRINGE FOR IV PUSH (FOR BLOOD PRESSURE SUPPORT)
80.0000 ug | PREFILLED_SYRINGE | INTRAVENOUS | Status: DC | PRN
  Filled 2018-10-15 (×2): qty 10

## 2018-10-15 MED ORDER — OXYCODONE-ACETAMINOPHEN 5-325 MG PO TABS: 2.0000 | ORAL_TABLET | ORAL | Status: DC | PRN

## 2018-10-15 MED ORDER — SODIUM CHLORIDE 0.9 % IV SOLN
2.0000 g | Freq: Once | INTRAVENOUS | Status: AC
  Administered 2018-10-15: 2 g via INTRAVENOUS
  Filled 2018-10-15: qty 2

## 2018-10-15 MED ORDER — SODIUM CHLORIDE 0.9 % IV SOLN
5.0000 10*6.[IU] | Freq: Once | INTRAVENOUS | Status: AC
  Administered 2018-10-15: 5 10*6.[IU] via INTRAVENOUS
  Filled 2018-10-15: qty 5

## 2018-10-15 MED ORDER — DIPHENHYDRAMINE HCL 50 MG/ML IJ SOLN: 12.5000 mg | INTRAMUSCULAR | Status: DC | PRN

## 2018-10-15 MED ORDER — TRANEXAMIC ACID-NACL 1000-0.7 MG/100ML-% IV SOLN
INTRAVENOUS | Status: AC
  Filled 2018-10-15: qty 100

## 2018-10-15 MED ORDER — LIDOCAINE HCL (PF) 1 % IJ SOLN
INTRAMUSCULAR | Status: DC | PRN
  Administered 2018-10-15 (×2): 5 mL via EPIDURAL

## 2018-10-15 MED ORDER — OXYCODONE-ACETAMINOPHEN 5-325 MG PO TABS: 1.0000 | ORAL_TABLET | ORAL | Status: DC | PRN

## 2018-10-15 MED ORDER — OXYTOCIN BOLUS FROM INFUSION
500.0000 mL | Freq: Once | INTRAVENOUS | Status: AC
  Administered 2018-10-15: 500 mL via INTRAVENOUS

## 2018-10-15 MED ORDER — ACETAMINOPHEN 325 MG PO TABS: 650.0000 mg | ORAL_TABLET | ORAL | Status: DC | PRN

## 2018-10-15 MED ORDER — LACTATED RINGERS IV SOLN: 500.0000 mL | INTRAVENOUS | Status: DC | PRN

## 2018-10-15 MED ORDER — PHENYLEPHRINE 40 MCG/ML (10ML) SYRINGE FOR IV PUSH (FOR BLOOD PRESSURE SUPPORT)
80.0000 ug | PREFILLED_SYRINGE | INTRAVENOUS | Status: DC | PRN
  Filled 2018-10-15: qty 10

## 2018-10-15 MED ORDER — TRANEXAMIC ACID-NACL 1000-0.7 MG/100ML-% IV SOLN
1000.0000 mg | Freq: Once | INTRAVENOUS | Status: AC
  Administered 2018-10-15: 1000 mg via INTRAVENOUS

## 2018-10-15 MED ORDER — EPHEDRINE 5 MG/ML INJ
10.0000 mg | INTRAVENOUS | Status: DC | PRN
  Filled 2018-10-15: qty 2

## 2018-10-15 MED ORDER — LACTATED RINGERS IV SOLN: INTRAVENOUS | Status: DC

## 2018-10-15 MED ORDER — FLEET ENEMA 7-19 GM/118ML RE ENEM: 1.0000 | ENEMA | Freq: Every day | RECTAL | Status: DC | PRN

## 2018-10-15 MED ORDER — MISOPROSTOL 200 MCG PO TABS
ORAL_TABLET | ORAL | Status: AC
  Filled 2018-10-15: qty 4

## 2018-10-15 MED ORDER — OXYTOCIN 40 UNITS IN NORMAL SALINE INFUSION - SIMPLE MED
2.5000 [IU]/h | INTRAVENOUS | Status: DC
  Administered 2018-10-15: 2.5 [IU]/h via INTRAVENOUS
  Filled 2018-10-15: qty 1000

## 2018-10-15 MED ORDER — SOD CITRATE-CITRIC ACID 500-334 MG/5ML PO SOLN: 30.0000 mL | ORAL | Status: DC | PRN

## 2018-10-15 MED ORDER — MISOPROSTOL 200 MCG PO TABS
800.0000 ug | ORAL_TABLET | Freq: Once | ORAL | Status: AC
  Administered 2018-10-15: 800 ug via RECTAL

## 2018-10-15 NOTE — Progress Notes (Signed)
Patient ID: Colleen Fisher, female   DOB: 10/15/1980, 38 y.o.   MRN: [id number]   Pt w some pressure.  Tired and ready to push, but hasn't rested.  Encouraged family to leave so she could relax before we start to push as pushing is hard work.  Encouraged them to give her a moment in quiet before pushing.    AFVSS  gen NAD FHTs 120's mod var, occ variables, one prolonged, + accels, category 1-2 toco q  10/100/+2  Will start pushing in a short time Expect SVD

## 2018-10-15 NOTE — Anesthesia Preprocedure Evaluation (Signed)
Anesthesia Evaluation  Patient identified by MRN, date of birth, ID band Patient awake    Reviewed: Allergy & Precautions, H&P , NPO status , Patient's Chart, lab work & pertinent test results  History of Anesthesia Complications Negative for: history of anesthetic complications  Airway Mallampati: II  TM Distance: >3 FB Neck ROM: full    Dental no notable dental hx. (+) Teeth Intact   Pulmonary neg pulmonary ROS,    Pulmonary exam normal breath sounds clear to auscultation       Cardiovascular negative cardio ROS Normal cardiovascular exam Rhythm:regular Rate:Normal     Neuro/Psych negative neurological ROS  negative psych ROS   GI/Hepatic negative GI ROS, Neg liver ROS,   Endo/Other  negative endocrine ROS  Renal/GU negative Renal ROS  negative genitourinary   Musculoskeletal   Abdominal (+) + obese,   Peds  Hematology negative hematology ROS (+)   Anesthesia Other Findings   Reproductive/Obstetrics (+) Pregnancy                             Anesthesia Physical Anesthesia Plan  ASA: II  Anesthesia Plan: Epidural   Post-op Pain Management:    Induction:   PONV Risk Score and Plan:   Airway Management Planned:   Additional Equipment:   Intra-op Plan:   Post-operative Plan:   Informed Consent: I have reviewed the patients History and Physical, chart, labs and discussed the procedure including the risks, benefits and alternatives for the proposed anesthesia with the patient or authorized representative who has indicated his/her understanding and acceptance.       Plan Discussed with:   Anesthesia Plan Comments:         Anesthesia Quick Evaluation  

## 2018-10-15 NOTE — Discharge Instructions (Signed)
Vaginal delivery means that you give birth by pushing your baby out of your birth canal (vagina). A team of health care providers will help you before, during, and after vaginal delivery. Birth experiences are unique for every woman and every pregnancy, and birth experiences vary depending on where you choose to give birth. What happens when I arrive at the birth center or hospital? Once you are in labor and have been admitted into the hospital or birth center, your health care provider may:  Review your pregnancy history and any concerns that you have.  Insert an IV into one of your veins. This may be used to give you fluids and medicines.  Check your blood pressure, pulse, temperature, and heart rate (vital signs).  Check whether your bag of water (amniotic sac) has broken (ruptured).  Talk with you about your birth plan and discuss pain control options. Monitoring Your health care provider may monitor your contractions (uterine monitoring) and your baby's heart rate (fetal monitoring). You may need to be monitored:  Often, but not continuously (intermittently).  All the time or for long periods at a time (continuously). Continuous monitoring may be needed if: ? You are taking certain medicines, such as medicine to relieve pain or make your contractions stronger. ? You have pregnancy or labor complications. Monitoring may be done by:  Placing a special stethoscope or a handheld monitoring device on your abdomen to check your baby's heartbeat and to check for contractions.  Placing monitors on your abdomen (external monitors) to record your baby's heartbeat and the frequency and length of contractions.  Placing monitors inside your uterus through your vagina (internal monitors) to record your baby's heartbeat and the frequency, length, and strength of your contractions. Depending on the type of monitor, it may remain in your uterus or on your baby's head until birth.  Telemetry. This is  a type of continuous monitoring that can be done with external or internal monitors. Instead of having to stay in bed, you are able to move around during telemetry. Physical exam Your health care provider may perform frequent physical exams. This may include:  Checking how and where your baby is positioned in your uterus.  Checking your cervix to determine: ? Whether it is thinning out (effacing). ? Whether it is opening up (dilating). What happens during labor and delivery?  Normal labor and delivery is divided into the following three stages: Stage 1  This is the longest stage of labor.  This stage can last for hours or days.  Throughout this stage, you will feel contractions. Contractions generally feel mild, infrequent, and irregular at first. They get stronger, more frequent (about every 2-3 minutes), and more regular as you move through this stage.  This stage ends when your cervix is completely dilated to 4 inches (10 cm) and completely effaced. Stage 2  This stage starts once your cervix is completely effaced and dilated and lasts until the delivery of your baby.  This stage may last from 20 minutes to 2 hours.  This is the stage where you will feel an urge to push your baby out of your vagina.  You may feel stretching and burning pain, especially when the widest part of your baby's head passes through the vaginal opening (crowning).  Once your baby is delivered, the umbilical cord will be clamped and cut. This usually occurs after waiting a period of 1-2 minutes after delivery.  Your baby will be placed on your bare chest (skin-to-skin contact) in   an upright position and covered with a warm blanket. Watch your baby for feeding cues, like rooting or sucking, and help the baby to your breast for his or her first feeding. Stage 3  This stage starts immediately after the birth of your baby and ends after you deliver the placenta.  This stage may take anywhere from 5 to 30  minutes.  After your baby has been delivered, you will feel contractions as your body expels the placenta and your uterus contracts to control bleeding. What can I expect after labor and delivery?  After labor is over, you and your baby will be monitored closely until you are ready to go home to ensure that you are both healthy. Your health care team will teach you how to care for yourself and your baby.  You and your baby will stay in the same room (rooming in) during your hospital stay. This will encourage early bonding and successful breastfeeding.  You may continue to receive fluids and medicines through an IV.  Your uterus will be checked and massaged regularly (fundal massage).  You will have some soreness and pain in your abdomen, vagina, and the area of skin between your vaginal opening and your anus (perineum).  If an incision was made near your vagina (episiotomy) or if you had some vaginal tearing during delivery, cold compresses may be placed on your episiotomy or your tear. This helps to reduce pain and swelling.  You may be given a squirt bottle to use instead of wiping when you go to the bathroom. To use the squirt bottle, follow these steps: ? Before you urinate, fill the squirt bottle with warm water. Do not use hot water. ? After you urinate, while you are sitting on the toilet, use the squirt bottle to rinse the area around your urethra and vaginal opening. This rinses away any urine and blood. ? Fill the squirt bottle with clean water every time you use the bathroom.  It is normal to have vaginal bleeding after delivery. Wear a sanitary pad for vaginal bleeding and discharge. Summary  Vaginal delivery means that you will give birth by pushing your baby out of your birth canal (vagina).  Your health care provider may monitor your contractions (uterine monitoring) and your baby's heart rate (fetal monitoring).  Your health care provider may perform a physical  exam.  Normal labor and delivery is divided into three stages.  After labor is over, you and your baby will be monitored closely until you are ready to go home. This information is not intended to replace advice given to you by your health care provider. Make sure you discuss any questions you have with your health care provider. Document Released: 07/03/2008 Document Revised: 10/29/2017 Document Reviewed: 10/29/2017 Elsevier Interactive Patient Education  2019 [employer]  

## 2018-10-15 NOTE — H&P (Signed)
Colleen Fisher is a 38 y.o. female G1 at 38+6 in active labor.  Coontraction increasing in intensity and frequency since MAU evaluation.  Pt is AMA - normal first trimester screen.  EDC by LMP cw first trimester [place].  Pt also w herpes.    OB History    Gravida  1   Para  0   Term  0   Preterm  0   AB  0   Living  0     SAB  0   TAB  0   Ectopic  0   Multiple  0   Live Births  0         G1 present  No abn pap + Chl and HSV  Past Medical History:  Diagnosis Date  . Anemia   . Blood transfusion without reported diagnosis   . Hx of chlamydia infection   . Vaginal Pap smear, abnormal    Past Surgical History:  Procedure Laterality Date  . ADENOIDECTOMY    . COLPOSCOPY    . ENDOMETRIAL BIOPSY     Family History: sarcoidosis, DM Social History:  reports that she has never smoked. She has never used smokeless tobacco. She reports that she does not drink alcohol or use drugs. single  Meds PNV All: NKDA     Maternal Diabetes: No Genetic Screening: Normal Maternal Ultrasounds/Referrals: Normal Fetal Ultrasounds or other Referrals:  None Maternal Substance Abuse:  No Significant Maternal Medications:  None Significant Maternal Lab Results:  Lab values include: Group B Strep positive Other Comments:  None  Review of Systems  Constitutional: Negative.   HENT: Negative.   Eyes: Negative.   Respiratory: Negative.   Cardiovascular: Negative.   Gastrointestinal: Negative.   Genitourinary: Negative.   Musculoskeletal: Negative.   Skin: Negative.   Neurological: Negative.   Psychiatric/Behavioral: Negative.    Maternal Medical History:  Reason for admission: Contractions.   Contractions: Onset was 6-12 hours ago.   Frequency: regular.   Perceived severity is strong.    Fetal activity: Perceived fetal activity is normal.    Prenatal Complications - Diabetes: none.    Dilation: 6.5 Effacement (%): 80 Station: -1 Exam by:: m wilkins rnc Blood  pressure 137/83, pulse 93, temperature (!) 97.5 F (36.4 C), temperature source Oral, resp. rate 20, height 5\' 3"  (1.6 m), weight 82.6 kg. Maternal Exam:  Uterine Assessment: Contraction frequency is regular.   Abdomen: Fundal height is appropriate for gestation.   Estimated fetal weight is 7#.   Fetal presentation: vertex  Introitus: Normal vulva. Normal vagina.    Physical Exam  Constitutional: She is oriented to person, place, and time. She appears well-developed and well-nourished.  HENT:  Head: Normocephalic and atraumatic.  Cardiovascular: Normal rate and regular rhythm.  Respiratory: Effort normal and breath sounds normal. No respiratory distress. She has no wheezes.  GI: Soft. Bowel sounds are normal. She exhibits no distension. There is no abdominal tenderness.  Genitourinary:    Vulva normal.   Musculoskeletal: Normal range of motion.  Neurological: She is alert and oriented to person, place, and time.  Skin: Skin is warm and dry.  Psychiatric: She has a normal mood and affect. Her behavior is normal.    Prenatal labs: ABO, Rh: --/--/O POS (01/08 1150) Antibody: NEG (01/08 1150) Rubella: Immune (06/13 0000) RPR: Nonreactive (06/13 0000)  HBsAg: Negative (06/13 0000)  HIV: Non-reactive (06/13 0000)  GBS: Positive (12/26 0000)   Assessment/Plan: 37yo G1 at 38+6 in active labor GBBS + -  get prophylaxis w Amp SROM on exam - clear Augment PRN Epidural prn Expect SVD   Colleen Fisher 10/15/2018, 1:30 PM

## 2018-10-15 NOTE — MAU Provider Note (Signed)
S: Ms. Colleen Fisher is a 38 y.o. G1P0 at [redacted]w[redacted]d  who presents to MAU today complaining of leaking of fluid since 2200. She denies vaginal bleeding. She endorses contractions. She reports normal fetal movement.    O: BP 129/81 (BP Location: Right Arm)   Pulse 90   Temp 97.9 F (36.6 C) (Oral)   Resp 19  GENERAL: Well-developed, well-nourished female in no acute distress.  HEAD: Normocephalic, atraumatic.  CHEST: Normal effort of breathing, regular heart rate ABDOMEN: Soft, nontender, gravid PELVIC: Normal external female genitalia. Vagina is pink and rugated. Cervix with normal contour, no lesions. Normal discharge.  no pooling.   Cervical exam:  Dilation: 1 Effacement (%): Thick Presentation: Vertex Exam by:: HEATHER, CNM   Fetal Monitoring: Baseline: 125 Variability: moderate Accelerations: 15x15 Decelerations: none Contractions: UI  Results for orders placed or performed during the hospital encounter of 10/14/18 (from the past 24 hour(s))  Amnisure rupture of membrane (rom)not at [facility]     Status: None   Collection Time: 10/15/18 12:23 AM  Result Value Ref Range   Amnisure ROM NEGATIVE      A: SIUP at [redacted]w[redacted]d  Membranes intact  P: DC home Term labor precautions Fetal kick counts    Thressa Sheller DNP, CNM  10/15/18  12:52 AM

## 2018-10-15 NOTE — Anesthesia Pain Management Evaluation Note (Signed)
  CRNA Pain Management Visit Note  Patient: Colleen Fisher, 38 y.o., female  "Hello I am a member of the anesthesia team at [facility]. We have an anesthesia team available at all times to provide care throughout the hospital, including epidural management and anesthesia for C-section. I don't know your plan for the delivery whether it a natural birth, water birth, IV sedation, nitrous supplementation, doula or epidural, but we want to meet your pain goals."   1.Was your pain managed to your expectations on prior hospitalizations?   No prior hospitalizations  2.What is your expectation for pain management during this hospitalization?     Epidural  3.How can we help you reach that goal? RN notified patient wants epidural now.  Record the patient's initial score and the patient's pain goal.   Pain: 8  Pain Goal: 7 [facility] wants you to be able to say your pain was always managed very well.  Kalyiah Saintil 10/15/2018

## 2018-10-15 NOTE — Telephone Encounter (Signed)
Preadmission screen  

## 2018-10-15 NOTE — Anesthesia Procedure Notes (Signed)
Epidural Patient location during procedure: OB Start time: 10/15/2018 2:17 PM End time: 10/15/2018 2:27 PM  Staffing Anesthesiologist: Leonides Grills, MD Performed: anesthesiologist   Preanesthetic Checklist Completed: patient identified, site marked, pre-op evaluation, timeout performed, IV checked, risks and benefits discussed and monitors and equipment checked  Epidural Patient position: sitting Prep: DuraPrep Patient monitoring: heart rate, cardiac monitor, continuous pulse ox and blood pressure Approach: midline Location: L4-L5 Injection technique: LOR air  Needle:  Needle type: Tuohy  Needle gauge: 17 G Needle length: 9 cm Needle insertion depth: 6 cm Catheter type: closed end flexible Catheter size: 19 Gauge Catheter at skin depth: 11 cm Test dose: negative and Other  Assessment Events: blood not aspirated, injection not painful, no injection resistance and negative IV test  Additional Notes Informed consent obtained prior to proceeding including risk of failure, 1% risk of PDPH, risk of minor discomfort and bruising. Discussed alternatives to epidural analgesia and patient desires to proceed.  Timeout performed pre-procedure verifying patient name, procedure, and platelet count.  Patient tolerated procedure well. Reason for block:procedure for pain

## 2018-10-16 LAB — CBC
HCT: 27.7 % — ABNORMAL LOW (ref 36.0–46.0)
Hemoglobin: 9.3 g/dL — ABNORMAL LOW (ref 12.0–15.0)
MCH: 31.1 pg (ref 26.0–34.0)
MCHC: 33.6 g/dL (ref 30.0–36.0)
MCV: 92.6 fL (ref 80.0–100.0)
PLATELETS: 133 10*3/uL — AB (ref 150–400)
RBC: 2.99 MIL/uL — ABNORMAL LOW (ref 3.87–5.11)
RDW: 13.7 % (ref 11.5–15.5)
WBC: 16.2 10*3/uL — ABNORMAL HIGH (ref 4.0–10.5)
nRBC: 0 % (ref 0.0–0.2)

## 2018-10-16 LAB — RPR: RPR Ser Ql: NONREACTIVE

## 2018-10-16 MED ORDER — ZOLPIDEM TARTRATE 5 MG PO TABS: 5.0000 mg | ORAL_TABLET | Freq: Every evening | ORAL | Status: DC | PRN

## 2018-10-16 MED ORDER — DIPHENHYDRAMINE HCL 25 MG PO CAPS: 25.0000 mg | ORAL_CAPSULE | Freq: Four times a day (QID) | ORAL | Status: DC | PRN

## 2018-10-16 MED ORDER — SENNOSIDES-DOCUSATE SODIUM 8.6-50 MG PO TABS
2.0000 | ORAL_TABLET | ORAL | Status: DC
  Administered 2018-10-16: 2 via ORAL
  Filled 2018-10-16: qty 2

## 2018-10-16 MED ORDER — PRENATAL MULTIVITAMIN CH
1.0000 | ORAL_TABLET | Freq: Every day | ORAL | Status: DC
  Administered 2018-10-16: 1 via ORAL
  Filled 2018-10-16 (×2): qty 1

## 2018-10-16 MED ORDER — DIBUCAINE 1 % RE OINT: 1.0000 "application " | TOPICAL_OINTMENT | RECTAL | Status: DC | PRN

## 2018-10-16 MED ORDER — OXYCODONE HCL 5 MG PO TABS: 5.0000 mg | ORAL_TABLET | ORAL | Status: DC | PRN

## 2018-10-16 MED ORDER — OXYCODONE HCL 5 MG PO TABS: 10.0000 mg | ORAL_TABLET | ORAL | Status: DC | PRN

## 2018-10-16 MED ORDER — ONDANSETRON HCL 4 MG PO TABS: 4.0000 mg | ORAL_TABLET | ORAL | Status: DC | PRN

## 2018-10-16 MED ORDER — BENZOCAINE-MENTHOL 20-0.5 % EX AERO
1.0000 "application " | INHALATION_SPRAY | CUTANEOUS | Status: DC | PRN
  Administered 2018-10-17: 1 via TOPICAL
  Filled 2018-10-16 (×2): qty 56

## 2018-10-16 MED ORDER — TETANUS-DIPHTH-ACELL PERTUSSIS 5-2.5-18.5 LF-MCG/0.5 IM SUSP: 0.5000 mL | Freq: Once | INTRAMUSCULAR | Status: DC

## 2018-10-16 MED ORDER — IBUPROFEN 600 MG PO TABS
600.0000 mg | ORAL_TABLET | Freq: Four times a day (QID) | ORAL | Status: DC
  Administered 2018-10-16 – 2018-10-17 (×7): 600 mg via ORAL
  Filled 2018-10-16 (×7): qty 1

## 2018-10-16 MED ORDER — COCONUT OIL OIL: 1.0000 "application " | TOPICAL_OIL | Status: DC | PRN

## 2018-10-16 MED ORDER — ACETAMINOPHEN 325 MG PO TABS: 650.0000 mg | ORAL_TABLET | ORAL | Status: DC | PRN

## 2018-10-16 MED ORDER — ONDANSETRON HCL 4 MG/2ML IJ SOLN: 4.0000 mg | INTRAMUSCULAR | Status: DC | PRN

## 2018-10-16 MED ORDER — SIMETHICONE 80 MG PO CHEW: 80.0000 mg | CHEWABLE_TABLET | ORAL | Status: DC | PRN

## 2018-10-16 MED ORDER — LACTATED RINGERS IV SOLN: INTRAVENOUS | Status: DC

## 2018-10-16 MED ORDER — WITCH HAZEL-GLYCERIN EX PADS: 1.0000 "application " | MEDICATED_PAD | CUTANEOUS | Status: DC | PRN

## 2018-10-16 NOTE — Progress Notes (Signed)
Post Partum Day 1/PP Hemorrhage - controlled w TXZ and cytotec Subjective: no complaints, up ad lib, voiding, tolerating PO and nl lochia, pain controlled. Ambulating well.    Objective: Blood pressure (!) 99/59, pulse 84, temperature 98.7 F (37.1 C), temperature source Oral, resp. rate 16, height 5\' 3"  (1.6 m), weight 82.6 kg, SpO2 100 %, unknown if currently breastfeeding.  Physical Exam:  General: alert and no distress Lochia: appropriate Uterine Fundus: firm  Recent Labs    10/15/18 2217 10/16/18 0610  HGB 12.2 9.3*  HCT 38.8 27.7*    Assessment/Plan: Plan for discharge tomorrow, Breastfeeding and Lactation consult  Hemoglobin decreased appropriately in conjunction w PP Hmg   LOS: 1 day   Colleen Fisher 10/16/2018, 6:41 AM

## 2018-10-16 NOTE — Addendum Note (Signed)
Addendum  created 10/16/18 0831 by Shanon Payor, CRNA   Charge Capture section accepted, Clinical Note Signed, Visit diagnoses modified

## 2018-10-16 NOTE — Anesthesia Postprocedure Evaluation (Signed)
Anesthesia Post Note  Patient: Colleen Fisher  Procedure(s) Performed: AN AD HOC LABOR EPIDURAL     Patient location during evaluation: Mother Baby Anesthesia Type: Epidural Level of consciousness: awake and alert Pain management: pain level controlled Vital Signs Assessment: post-procedure vital signs reviewed and stable Respiratory status: spontaneous breathing, nonlabored ventilation and respiratory function stable Cardiovascular status: stable Postop Assessment: no headache, no backache and epidural receding Anesthetic complications: no    Last Vitals:  Vitals:   10/16/18 0100 10/16/18 0539  BP: 111/64 (!) 99/59  Pulse: 92 84  Resp: 18 16  Temp: 37.4 C 37.1 C  SpO2: 100% 100%    Last Pain:  Vitals:   10/16/18 0539  TempSrc: Oral  PainSc:    Pain Goal:                 [name]

## 2018-10-16 NOTE — Lactation Note (Signed)
This note was copied from a baby's chart. Lactation Consultation Note  Patient Name: Colleen Fisher [id number] Date: 10/16/2018 Reason for consult: Initial assessment;1st time breastfeeding;Early term 37-38.6wks P1, 8 hour female infant, infant DAT+ Per mom, she did not attend any BF classes in pregnancy. Mom went 4 hours without BF infant. LC discussed mom will breastfeed according hunger cues and not exceed 3 hours without breastfeeding infant.  LC asked mom wake baby to breastfeed. Mom latched infant on right breast using foot ball hold, after few attempts infant sustained latch for 20 minutes. Mom was taught hand expression and Infant was given 4 ml of colostrum by spoon. Mom given DEBP and Mom will pump every 3 hours LC explained possible risk of jaundice and mom can give infant back EBM. LC discussed I & O. Mom shown how to use DEBP & how to disassemble, clean, & reassemble parts. Mom made aware of O/P services, breastfeeding support groups, community resources, and our phone # for post-discharge questions.   Maternal Data Formula Feeding for Exclusion: No Has patient been taught Hand Expression?: Yes(Mom demonstrated hand expression infant given 4 ml by spoon. )  Feeding Feeding Type: Breast Fed  LATCH Score Latch: Repeated attempts needed to sustain latch, nipple held in mouth throughout feeding, stimulation needed to elicit sucking reflex.  Audible Swallowing: A few with stimulation  Type of Nipple: Everted at rest and after stimulation  Comfort (Breast/Nipple): Soft / non-tender  Hold (Positioning): Assistance needed to correctly position infant at breast and maintain latch.  LATCH Score: 7  Interventions Interventions: Breast feeding basics reviewed;Assisted with latch;Skin to skin;Breast massage;Adjust position;Breast compression;DEBP;Support pillows;Hand express  Lactation Tools Discussed/Used Tools: Pump Breast pump type: Double-Electric Breast Pump WIC Program:  No Pump Review: Setup, frequency, and cleaning;Milk Storage Initiated by:: Danelle Earthly, IBCLC Date initiated:: 10/16/18   Consult Status Consult Status: Follow-up Date: 10/16/18 Follow-up type: In-patient    Danelle Earthly 10/16/2018, 5:33 AM

## 2018-10-16 NOTE — Lactation Note (Signed)
This note was copied from a baby's chart. Lactation Consultation Note  Patient Name: Colleen Fisher [id number] Date: 10/16/2018 Reason for consult: Follow-up assessment;Early term 37-38.6wks;Primapara;1st time breastfeeding  P1 mother whose infant is now 76 hours old.  This is an ETI at 38+6 weeks.  Mother requested latch assistance.  Baby was not showing feeding cues when I arrived.  Showed mother how to help awaken a sleepy baby.  Mother's breasts are soft and non tender and nipples are everted but very short shafted bilaterally.  Assisted baby to latch onto the left breast in the football hold.  Demonstrated how to do breast compressions during feedings and mother was able to do this.  She needed reminders to keep her fingers back away from areola when compressing.  Observed baby feeding for 11 minutes while I reviewed breast feeding basics with mother.  Due to her short shafted nipples I suggested breast shells and a manual pump.  Mother was willing to try both of these tools.  Instructions provided.  Mother had a DEBP at bedside but was unsure why she needed to use this.  She had not been pumping consistently.  She was also unsure of how to operate pump.  Reviewed pump parts, assembly, disassembly and  Cleaning.  #24 flange size is appropriate at this time.    Pediatrician arrived for an assessment while I was visiting and explained to mother that baby would be getting a bilirubin level drawn at approximately 1400 and may be under phototherapy.  Due to the increased risk of jaundice I emphasized the importance of feeding and pumping every three hours.  Set mother up with the pump and she began pumping.  Answered all her questions related to pumping.  Mother will alert RN when she begins to obtain EBM for options as to feeding methods.  Finger feeding demonstrated and I encouraged mother to continue hand expression before/after feedings.  Visitor in room and a good support person.  RN  updated.   Maternal Data Formula Feeding for Exclusion: No Has patient been taught Hand Expression?: Yes Does the patient have breastfeeding experience prior to this delivery?: No  Feeding Feeding Type: Breast Fed  LATCH Score Latch: Grasps breast easily, tongue down, lips flanged, rhythmical sucking.  Audible Swallowing: A few with stimulation  Type of Nipple: Everted at rest and after stimulation(short shafted bilaterally)  Comfort (Breast/Nipple): Soft / non-tender  Hold (Positioning): Assistance needed to correctly position infant at breast and maintain latch.  LATCH Score: 8  Interventions Interventions: Breast feeding basics reviewed;Assisted with latch;Skin to skin;Breast massage;Hand express;Pre-pump if needed;Breast compression;Hand pump;Shells;Position options;Support pillows;Adjust position;DEBP  Lactation Tools Discussed/Used Tools: Shells;Pump Shell Type: Inverted Breast pump type: Double-Electric Breast Pump;Manual WIC Program: No Pump Review: Setup, frequency, and cleaning;Milk Storage Initiated by:: Jacy Howat Date initiated:: 10/16/18   Consult Status Consult Status: Follow-up Date: 10/17/18 Follow-up type: In-patient    Lokelani Lutes R Castor Gittleman 10/16/2018, 1:56 PM

## 2018-10-16 NOTE — Anesthesia Postprocedure Evaluation (Signed)
Anesthesia Post Note  Patient: Colleen Fisher  Procedure(s) Performed: AN AD HOC LABOR EPIDURAL     Patient location during evaluation: Mother Baby Anesthesia Type: Epidural Level of consciousness: awake and alert and oriented Pain management: satisfactory to patient Vital Signs Assessment: post-procedure vital signs reviewed and stable Respiratory status: spontaneous breathing and nonlabored ventilation Cardiovascular status: stable Postop Assessment: no headache, no backache, no signs of nausea or vomiting, adequate PO intake, patient able to bend at knees and able to ambulate (patient up walking) Anesthetic complications: no    Last Vitals:  Vitals:   10/16/18 0100 10/16/18 0539  BP: 111/64 (!) 99/59  Pulse: 92 84  Resp: 18 16  Temp: 37.4 C 37.1 C  SpO2: 100% 100%    Last Pain:  Vitals:   10/16/18 0539  TempSrc: Oral  PainSc:    Pain Goal:                 Madison Hickman

## 2018-10-16 NOTE — Progress Notes (Signed)
Patient ID: Colleen Fisher, female   DOB: 1980/12/18, 38 y.o.   MRN: [id number] Pt doing well with no complaints. Slight dry cough but significantly improved from before. Pain well controlled. Bonding well with baby. Lochia mild. Denies dizziness VSS ABD - FF EXT - no homans  A/P: G1P0 on PPD#1 s/p svd with pp hemorrhage - now stable Routine pp care Circ tomorrow before discharge as baby now voiding well

## 2018-10-17 MED ORDER — IBUPROFEN 600 MG PO TABS: 600.0000 mg | ORAL_TABLET | Freq: Four times a day (QID) | ORAL | 1 refills | Status: DC | PRN

## 2018-10-17 NOTE — Discharge Summary (Signed)
OB Discharge Summary     Patient Name: Colleen Fisher DOB: 1981/05/01 MRN: [id number]  Date of admission: 10/15/2018 Delivering MD: Sherian Rein   Date of discharge: 10/17/2018  Admitting diagnosis: 39wks, LABOR Intrauterine pregnancy: [redacted]w[redacted]d     Secondary diagnosis:  Principal Problem:   Uterine atony, postpartum, current hospitalization Active Problems:   Normal labor and delivery   SVD (spontaneous vaginal delivery)  Additional problems: postpartum hemorrhage     Discharge diagnosis: Term Pregnancy Delivered                                                                                                Post partum procedures:none  Augmentation: AROM  Complications: None  Hospital course:  Onset of Labor With Vaginal Delivery     38 y.o. yo G1P1001 at [redacted]w[redacted]d was admitted in Active Labor on 10/15/2018. Patient had an uncomplicated labor course as follows:  Membrane Rupture Time/Date: 1:00 PM ,10/15/2018   Intrapartum Procedures: Episiotomy: None [1]                                         Lacerations:  2nd degree [3]  Patient had a delivery of a Viable infant. 10/15/2018  Information for the patient's newborn:  Colleen, Fisher [[id number]]  Delivery Method: Vaginal, Spontaneous(Filed from Delivery Summary)    Pateint had an uncomplicated postpartum course.  She is ambulating, tolerating a regular diet, passing flatus, and urinating well. Patient is discharged home in stable condition on 10/17/18.   Physical exam  Vitals:   10/16/18 0908 10/16/18 1340 10/16/18 2304 10/17/18 0549  BP: 100/64 93/65 (!) 103/57 98/67  Pulse: 82 84 84 81  Resp: 17 18 16    Temp: 98.2 F (36.8 C) 98.3 F (36.8 C) 98 F (36.7 C)   TempSrc: Oral Oral Oral   SpO2:   99%   Weight:      Height:       General: alert, cooperative and no distress Lochia: appropriate Uterine Fundus: firm Incision: N/A DVT Evaluation: No evidence of DVT seen on physical exam. No significant calf/ankle  edema. Labs: Lab Results  Component Value Date   WBC 16.2 (H) 10/16/2018   HGB 9.3 (L) 10/16/2018   HCT 27.7 (L) 10/16/2018   MCV 92.6 10/16/2018   PLT 133 (L) 10/16/2018   CMP Latest Ref Rng & Units 09/16/2018  Glucose 70 - 99 mg/dL 22(W)  BUN 6 - 20 mg/dL 9  Creatinine 9.79 - 8.92 mg/dL 1.19  Sodium 417 - 408 mmol/L 134(L)  Potassium 3.5 - 5.1 mmol/L 3.7  Chloride 98 - 111 mmol/L 103  CO2 22 - 32 mmol/L 22  Calcium 8.9 - 10.3 mg/dL 1.4(G)  Total Protein 6.5 - 8.1 g/dL 7.8  Total Bilirubin 0.3 - 1.2 mg/dL 0.8  Alkaline Phos 38 - 126 U/L 85  AST 15 - 41 U/L 26  ALT 0 - 44 U/L 16    Discharge instruction: per After Visit Summary and "Baby and Me Booklet".  After visit meds:  Allergies as of 10/17/2018   No Known Allergies     Medication List    TAKE these medications   ibuprofen 600 MG tablet Commonly known as:  ADVIL,MOTRIN Take 1 tablet (600 mg total) by mouth every 6 (six) hours as needed.   prenatal multivitamin Tabs tablet Take 1 tablet by mouth daily at 12 noon.   valACYclovir 500 MG tablet Commonly known as:  VALTREX Take 500 mg by mouth daily.       Diet: routine diet  Activity: Advance as tolerated. Pelvic rest for 6 weeks.   Outpatient follow up:6 weeks Follow up Appt:No future appointments. Follow up Visit:No follow-ups on file.  Postpartum contraception: Not Discussed  Newborn Data: Live born female  Birth Weight: 7 lb 7.4 oz (3385 g) APGAR: 8, 9  Newborn Delivery   Birth date/time:  10/15/2018 21:21:00 Delivery type:  Vaginal, Spontaneous     Baby Feeding: Breast Disposition:home with mother   10/17/2018 Colleen Musterecilia W Banga, DO

## 2018-10-17 NOTE — Discharge Instructions (Signed)
Call office at [phone]

## 2018-10-17 NOTE — Lactation Note (Signed)
This note was copied from a baby's chart. Lactation Consultation Note: Baby asleep in bassinet. Circ this morning. Mom reports she tried to latch him 25 min ago when he came back to room but he was too sleepy. Mom reports he has been latching pretty well- having some trouble with latch to right breast. Encouraged to page for feeding assist when he wakes up. Did give formula at 6 am because he was so fussy through the night,  Encouraged to pump since he did not nurse. No questions at present.   Patient Name: Colleen Fisher Date: 10/17/2018 Reason for consult: Follow-up assessment   Maternal Data Formula Feeding for Exclusion: No Has patient been taught Hand Expression?: Yes Does the patient have breastfeeding experience prior to this delivery?: No  Feeding Feeding Type: Breast Fed Nipple Type: Regular  LATCH Score                   Interventions    Lactation Tools Discussed/Used     Consult Status Consult Status: Follow-up Date: 10/18/18 Follow-up type: In-patient    Pamelia Hoit 10/17/2018, 9:45 AM

## 2018-10-17 NOTE — Lactation Note (Signed)
This note was copied from a baby's chart. Lactation Consultation Note  Patient Name: Colleen Fisher SHFWY'O Date: 10/17/2018 Reason for consult: Follow-up assessment;Primapara;1st time breastfeeding;Infant weight loss;Other (Comment);Early term 37-38.6wks(DAT (+))  16 hours old early term female who is now being partially BF and formula fed by his mother, she's a P1. Baby is DAT (+) and at 5% weight loss. He was circumcised this morning and not ready to feed due to the being sleepy. Baby was already awake when entering the room, offered assistance with latch and mom agreed to have him STS.   LC took baby to mom's right breast in football position and he was able to latch after a few tried, LC had to relatch baby after first attempt, he was able to suck on a gloved finger, finger fed baby prior latching. He stayed on for 18 minutes with a few audible swallows heard upon breast compressions. Mom was very pleased, she was very engaged during [facility] consultation and had lots of questions.  Discussed supplementation with formula and EBM, transition to baby when she goes back to work and pumping for comfort when she's ready to quit BF and dry her supply up. Reviewed discharge instructions on when to call baby's pediatrician, engorgement prevention and treatment and treatment for sore nipples.   Feeding plan:  1. Encouraged mom to keep taking baby STS 8-12 times/24 hours or sooner if feeding cues are present 2. Mom will also supplement baby with Enfamil 20 calorie formula PRN due to DAT (+) status; mom only committed to BF for 6 weeks.  Mom reported all questions and concerns were answered, she's aware of LC OP services and will call PRN.  Maternal Data    Feeding Feeding Type: Breast Fed  Interventions Interventions: Breast feeding basics reviewed;Assisted with latch;Skin to skin;Breast massage;Hand express;Breast compression;Support pillows;Adjust position  Lactation Tools Discussed/Used      Consult Status Consult Status: Complete Date: 10/17/18 Follow-up type: Call as needed    Dannah Ryles Venetia Constable 10/17/2018, 2:25 PM

## 2018-10-17 NOTE — Progress Notes (Signed)
Patient ID: Colleen Fisher, female   DOB: [birth year], 38 y.o.   MRN: [id number] Pt doing well with no complaints. Pain well controlled. Lochia mild. No dizziness with ambulating. Denies HA, blurry vision, SOB or CP. She is bonding well with baby. Desires circumcision. Ready for discharge to home today VSS - 98-103/57-67 ABD - FF and 2cm below umbilicus EXT - no homans  A/P: PPD#2 s/p svd - stable         Reviewed discharge instructions         F/u in 6 weeks in office         Circumcision prior to discharge

## 2018-10-18 ENCOUNTER — Ambulatory Visit: Payer: Self-pay

## 2018-10-18 NOTE — Lactation Note (Signed)
This note was copied from a baby's chart. Lactation Consultation Note  Patient Name: Colleen Fisher ZOXWR'U Date: 10/18/2018 Reason for consult: Follow-up assessment;Early term 70-38.6wks Mom has been formula feeding because she can't latch baby alone without hurting.  Breasts are very full but not engorged.  Mom has been pumping every 3 hours and last obtained 30 mls.  She has a Medela DEBP at home.  Discussed prevention and treatment of engorgement.  Baby showing feeding cues and I offered assist.  Positioned baby in football hold.  Mom shown how to obtain a deep latch.  Baby latched easily and well.  Feeding observed for 10 minutes and baby pulled of relaxed and content.  Reviewed basics and answered questions.  Outpatient lactation services and support reviewed and encouraged prn.  Maternal Data    Feeding Feeding Type: Breast Fed Nipple Type: Slow - flow  LATCH Score Latch: Grasps breast easily, tongue down, lips flanged, rhythmical sucking.  Audible Swallowing: Spontaneous and intermittent  Type of Nipple: Everted at rest and after stimulation  Comfort (Breast/Nipple): Soft / non-tender  Hold (Positioning): Assistance needed to correctly position infant at breast and maintain latch.  LATCH Score: 9  Interventions Interventions: Assisted with latch;Breast compression;Skin to skin;Adjust position;Breast massage;Support pillows;DEBP  Lactation Tools Discussed/Used     Consult Status Consult Status: Complete Follow-up type: Call as needed    Huston Foley 10/18/2018, 12:21 PM

## 2018-10-22 ENCOUNTER — Inpatient Hospital Stay (HOSPITAL_COMMUNITY): Admission: RE | Admit: 2018-10-22 | Payer: 59 | Source: Ambulatory Visit

## 2019-04-29 ENCOUNTER — Other Ambulatory Visit: Payer: Self-pay

## 2019-04-29 DIAGNOSIS — Z20822 Contact with and (suspected) exposure to covid-19: Secondary | ICD-10-CM

## 2019-05-02 LAB — NOVEL CORONAVIRUS, NAA: SARS-CoV-2, NAA: NOT DETECTED

## 2019-11-11 DIAGNOSIS — Z0000 Encounter for general adult medical examination without abnormal findings: Secondary | ICD-10-CM | POA: Diagnosis not present

## 2019-11-11 DIAGNOSIS — E785 Hyperlipidemia, unspecified: Secondary | ICD-10-CM | POA: Diagnosis not present

## 2019-11-18 DIAGNOSIS — M79642 Pain in left hand: Secondary | ICD-10-CM | POA: Diagnosis not present

## 2019-11-18 DIAGNOSIS — Z0000 Encounter for general adult medical examination without abnormal findings: Secondary | ICD-10-CM | POA: Diagnosis not present

## 2019-11-18 DIAGNOSIS — Z113 Encounter for screening for infections with a predominantly sexual mode of transmission: Secondary | ICD-10-CM | POA: Diagnosis not present

## 2019-11-18 DIAGNOSIS — Z124 Encounter for screening for malignant neoplasm of cervix: Secondary | ICD-10-CM | POA: Diagnosis not present

## 2019-11-20 DIAGNOSIS — J029 Acute pharyngitis, unspecified: Secondary | ICD-10-CM | POA: Diagnosis not present

## 2019-11-20 DIAGNOSIS — J020 Streptococcal pharyngitis: Secondary | ICD-10-CM | POA: Diagnosis not present

## 2020-02-25 ENCOUNTER — Ambulatory Visit: Payer: Medicaid Other | Attending: Internal Medicine

## 2020-02-25 DIAGNOSIS — Z23 Encounter for immunization: Secondary | ICD-10-CM

## 2020-02-25 NOTE — Progress Notes (Signed)
   Covid-19 Vaccination Clinic  Name:  Colleen Fisher    MRN: [id number] DOB: Sep 10, 1981  02/25/2020  [name] was observed post Covid-19 immunization for 15 minutes without incident. She was provided with Vaccine Information Sheet and instruction to access the V-Safe system.   [name] was instructed to call 911 with any severe reactions post vaccine: Marland Kitchen Difficulty breathing  . Swelling of face and throat  . A fast heartbeat  . A bad rash all over body  . Dizziness and weakness   Immunizations Administered    Name Date Dose VIS Date Route   Pfizer COVID-19 Vaccine 02/25/2020  9:57 AM 0.3 mL 12/02/2018 Intramuscular   Manufacturer: [employer], [employer]   Lot: [id number]   NDC: [address]-0

## 2020-03-24 ENCOUNTER — Ambulatory Visit: Payer: Medicaid Other | Attending: Internal Medicine

## 2020-03-24 ENCOUNTER — Ambulatory Visit: Payer: Medicaid Other

## 2020-03-24 DIAGNOSIS — Z23 Encounter for immunization: Secondary | ICD-10-CM

## 2020-03-24 NOTE — Progress Notes (Signed)
   Covid-19 Vaccination Clinic  Name:  Colleen Fisher    MRN: [id number] DOB: 1981/01/19  03/24/2020  Colleen Fisher was observed post Covid-19 immunization for 15 minutes without incident. She was provided with Vaccine Information Sheet and instruction to access the V-Safe system.   Colleen Fisher was instructed to call 911 with any severe reactions post vaccine: Marland Kitchen Difficulty breathing  . Swelling of face and throat  . A fast heartbeat  . A bad rash all over body  . Dizziness and weakness   Immunizations Administered    Name Date Dose VIS Date Route   Pfizer COVID-19 Vaccine 03/24/2020  4:53 PM 0.3 mL 12/02/2018 Intramuscular   Manufacturer: [employer], [employer]   Lot: [id number]   NDC: [address]-4

## 2020-03-25 DIAGNOSIS — Z20822 Contact with and (suspected) exposure to covid-19: Secondary | ICD-10-CM | POA: Diagnosis not present

## 2020-03-25 DIAGNOSIS — Z03818 Encounter for observation for suspected exposure to other biological agents ruled out: Secondary | ICD-10-CM | POA: Diagnosis not present

## 2020-06-08 DIAGNOSIS — L298 Other pruritus: Secondary | ICD-10-CM | POA: Diagnosis not present

## 2020-08-10 DIAGNOSIS — Z1231 Encounter for screening mammogram for malignant neoplasm of breast: Secondary | ICD-10-CM | POA: Diagnosis not present

## 2020-11-16 DIAGNOSIS — R946 Abnormal results of thyroid function studies: Secondary | ICD-10-CM | POA: Diagnosis not present

## 2020-11-16 DIAGNOSIS — R7309 Other abnormal glucose: Secondary | ICD-10-CM | POA: Diagnosis not present

## 2020-11-16 DIAGNOSIS — B009 Herpesviral infection, unspecified: Secondary | ICD-10-CM | POA: Diagnosis not present

## 2020-11-16 DIAGNOSIS — Z0000 Encounter for general adult medical examination without abnormal findings: Secondary | ICD-10-CM | POA: Diagnosis not present

## 2020-11-16 DIAGNOSIS — Z113 Encounter for screening for infections with a predominantly sexual mode of transmission: Secondary | ICD-10-CM | POA: Diagnosis not present

## 2020-11-23 DIAGNOSIS — D649 Anemia, unspecified: Secondary | ICD-10-CM | POA: Diagnosis not present

## 2020-11-23 DIAGNOSIS — R946 Abnormal results of thyroid function studies: Secondary | ICD-10-CM | POA: Diagnosis not present

## 2020-11-23 DIAGNOSIS — Z0000 Encounter for general adult medical examination without abnormal findings: Secondary | ICD-10-CM | POA: Diagnosis not present

## 2020-11-23 DIAGNOSIS — R42 Dizziness and giddiness: Secondary | ICD-10-CM | POA: Diagnosis not present

## 2020-11-23 DIAGNOSIS — B009 Herpesviral infection, unspecified: Secondary | ICD-10-CM | POA: Diagnosis not present

## 2021-03-28 DIAGNOSIS — H6590 Unspecified nonsuppurative otitis media, unspecified ear: Secondary | ICD-10-CM | POA: Diagnosis not present

## 2021-03-28 DIAGNOSIS — J019 Acute sinusitis, unspecified: Secondary | ICD-10-CM | POA: Diagnosis not present

## 2021-05-24 DIAGNOSIS — Z3009 Encounter for other general counseling and advice on contraception: Secondary | ICD-10-CM | POA: Diagnosis not present

## 2021-07-01 DIAGNOSIS — J029 Acute pharyngitis, unspecified: Secondary | ICD-10-CM | POA: Diagnosis not present

## 2021-08-16 DIAGNOSIS — Z1231 Encounter for screening mammogram for malignant neoplasm of breast: Secondary | ICD-10-CM | POA: Diagnosis not present

## 2021-08-30 DIAGNOSIS — R059 Cough, unspecified: Secondary | ICD-10-CM | POA: Diagnosis not present

## 2021-08-30 DIAGNOSIS — B349 Viral infection, unspecified: Secondary | ICD-10-CM | POA: Diagnosis not present

## 2021-09-06 DIAGNOSIS — R922 Inconclusive mammogram: Secondary | ICD-10-CM | POA: Diagnosis not present

## 2021-09-22 DIAGNOSIS — J029 Acute pharyngitis, unspecified: Secondary | ICD-10-CM | POA: Diagnosis not present

## 2021-10-14 ENCOUNTER — Emergency Department (HOSPITAL_COMMUNITY)
Admission: EM | Admit: 2021-10-14 | Discharge: 2021-10-14 | Disposition: A | Discharge status: Home / Self Care | Payer: BC Managed Care – PPO | Attending: Emergency Medicine | Admitting: Emergency Medicine

## 2021-10-14 ENCOUNTER — Other Ambulatory Visit: Payer: Self-pay

## 2021-10-14 ENCOUNTER — Encounter (HOSPITAL_COMMUNITY): Payer: Self-pay | Admitting: *Deleted

## 2021-10-14 DIAGNOSIS — R531 Weakness: Secondary | ICD-10-CM | POA: Diagnosis not present

## 2021-10-14 DIAGNOSIS — Z20822 Contact with and (suspected) exposure to covid-19: Secondary | ICD-10-CM | POA: Insufficient documentation

## 2021-10-14 DIAGNOSIS — D509 Iron deficiency anemia, unspecified: Secondary | ICD-10-CM | POA: Diagnosis not present

## 2021-10-14 DIAGNOSIS — R197 Diarrhea, unspecified: Secondary | ICD-10-CM | POA: Insufficient documentation

## 2021-10-14 DIAGNOSIS — D649 Anemia, unspecified: Secondary | ICD-10-CM | POA: Diagnosis not present

## 2021-10-14 DIAGNOSIS — R112 Nausea with vomiting, unspecified: Secondary | ICD-10-CM | POA: Diagnosis not present

## 2021-10-14 LAB — CBC
HCT: 31.5 % — ABNORMAL LOW (ref 36.0–46.0)
Hemoglobin: 8.7 g/dL — ABNORMAL LOW (ref 12.0–15.0)
MCH: 20.5 pg — ABNORMAL LOW (ref 26.0–34.0)
MCHC: 27.6 g/dL — ABNORMAL LOW (ref 30.0–36.0)
MCV: 74.3 fL — ABNORMAL LOW (ref 80.0–100.0)
Platelets: 275 10*3/uL (ref 150–400)
RBC: 4.24 MIL/uL (ref 3.87–5.11)
RDW: 19.8 % — ABNORMAL HIGH (ref 11.5–15.5)
WBC: 5 10*3/uL (ref 4.0–10.5)
nRBC: 0 % (ref 0.0–0.2)

## 2021-10-14 LAB — COMPREHENSIVE METABOLIC PANEL
ALT: 17 U/L (ref 0–44)
AST: 24 U/L (ref 15–41)
Albumin: 4.4 g/dL (ref 3.5–5.0)
Alkaline Phosphatase: 42 U/L (ref 38–126)
Anion gap: 7 (ref 5–15)
BUN: 14 mg/dL (ref 6–20)
CO2: 25 mmol/L (ref 22–32)
Calcium: 8.6 mg/dL — ABNORMAL LOW (ref 8.9–10.3)
Chloride: 105 mmol/L (ref 98–111)
Creatinine, Ser: 0.77 mg/dL (ref 0.44–1.00)
GFR, Estimated: >60 mL/min (ref >60)
Glucose, Bld: 98 mg/dL (ref 70–99)
Potassium: 3.3 mmol/L — ABNORMAL LOW (ref 3.5–5.1)
Sodium: 137 mmol/L (ref 135–145)
Total Bilirubin: 0.7 mg/dL (ref 0.3–1.2)
Total Protein: 8.3 g/dL — ABNORMAL HIGH (ref 6.5–8.1)

## 2021-10-14 LAB — RESP PANEL BY RT-PCR (FLU A&B, COVID) ARPGX2
Influenza A by PCR: NEGATIVE
Influenza B by PCR: NEGATIVE
SARS Coronavirus 2 by RT PCR: NEGATIVE

## 2021-10-14 LAB — LIPASE, BLOOD: Lipase: 26 U/L (ref 11–51)

## 2021-10-14 LAB — I-STAT BETA HCG BLOOD, ED (MC, WL, AP ONLY): I-stat hCG, quantitative: <5 m[IU]/mL (ref <5)

## 2021-10-14 MED ORDER — ONDANSETRON HCL 4 MG PO TABS: 4.0000 mg | ORAL_TABLET | Freq: Four times a day (QID) | ORAL | 0 refills | Status: DC

## 2021-10-14 NOTE — Discharge Instructions (Addendum)
As we discussed your work-up today was significant for anemia that is consistent with iron deficiency anemia.  I recommend that you increase your dietary intake of iron, you can take some iron supplementation.  The additional tool that we were discussing is a "iron fish" that you can add to your cooking in order to get natural iron in your meals.  Based on your otherwise well-appearing lab work, and stable vital signs, I think that your nausea, vomiting, diarrhea is likely secondary to a gastrointestinal virus, this should resolve on its own, I encourage you to drink plenty of water, you are welcome to try Imodium over-the-counter for diarrhea.  This is surely I am sending a prescription for Zofran which is a nausea medication that you can take as needed for nausea, vomiting.

## 2021-10-14 NOTE — ED Provider Notes (Signed)
[facility]-EMERGENCY DEPT Provider Note   CSN: [id number] Arrival date & time: 10/14/21  1405     History  Chief Complaint  Patient presents with   Weakness   Nausea   Emesis   Diarrhea    Colleen Fisher is a 41 y.o. female with a past medical history significant for history of iron deficiency anemia who presents for evaluation of weakness, as well as a few isolated incidence of nausea, vomiting, diarrhea over the last 3 days.  Patient reports that her symptoms began with feeling weakness at work, followed by emesis x1.  She denies any blood in her emesis.  Patient reports that she felt overall okay on Friday, with maybe some diarrhea, and then had recurrence of her symptoms today with another episode of emesis x2.  Patient denies any increase in her vaginal bleeding, reports that she is just finishing her menstrual period.  Patient reports that she does not regularly take her iron supplementation, or B12.   Weakness Associated symptoms: diarrhea and vomiting   Emesis Associated symptoms: diarrhea   Diarrhea Associated symptoms: vomiting       Home Medications Prior to Admission medications   Medication Sig Start Date End Date Taking? Authorizing Provider  ondansetron (ZOFRAN) 4 MG tablet Take 1 tablet (4 mg total) by mouth every 6 (six) hours. 10/14/21  Yes Jakhai Fant H, PA-C  ibuprofen (ADVIL,MOTRIN) 600 MG tablet Take 1 tablet (600 mg total) by mouth every 6 (six) hours as needed. 10/17/18   Edwinna Areola, DO  Prenatal Vit-Fe Fumarate-FA (PRENATAL MULTIVITAMIN) TABS tablet Take 1 tablet by mouth daily at 12 noon.    [provider]  valACYclovir (VALTREX) 500 MG tablet Take 500 mg by mouth daily.     [provider]      Allergies    Patient has no known allergies.    Review of Systems   Review of Systems  Gastrointestinal:  Positive for diarrhea and vomiting.  Neurological:  Positive for weakness.  All other systems  reviewed and are negative.  Physical Exam Updated Vital Signs BP 108/74 (BP Location: Left Arm)    Pulse 80    Temp 98.6 F (37 C) (Oral)    Resp 16    Ht 5\' 3"  (1.6 m)    Wt 63.5 kg    SpO2 100%    BMI 24.80 kg/m  Physical Exam Vitals and nursing note reviewed.  Constitutional:      General: She is not in acute distress.    Appearance: Normal appearance.  HENT:     Head: Normocephalic and atraumatic.     Comments: pale mucous membranes in conjunctivae, as well as mouth Eyes:     General:        Right eye: No discharge.        Left eye: No discharge.  Cardiovascular:     Rate and Rhythm: Normal rate and regular rhythm.     Heart sounds: No murmur heard.   No friction rub. No gallop.  Pulmonary:     Effort: Pulmonary effort is normal.     Breath sounds: Normal breath sounds.  Abdominal:     General: Bowel sounds are normal.     Palpations: Abdomen is soft.     Comments: No significant tenderness to palpation of the abdomen, no rebound, rigidity, guarding  Skin:    General: Skin is warm and dry.     Capillary Refill: Capillary refill takes less than 2  seconds.  Neurological:     Mental Status: She is alert and oriented to person, place, and time.  Psychiatric:        Mood and Affect: Mood normal.        Behavior: Behavior normal.    ED Results / Procedures / Treatments   Labs (all labs ordered are listed, but only abnormal results are displayed) Labs Reviewed  COMPREHENSIVE METABOLIC PANEL - Abnormal; Notable for the following components:      Result Value   Potassium 3.3 (*)    Calcium 8.6 (*)    Total Protein 8.3 (*)    All other components within normal limits  CBC - Abnormal; Notable for the following components:   Hemoglobin 8.7 (*)    HCT 31.5 (*)    MCV 74.3 (*)    MCH 20.5 (*)    MCHC 27.6 (*)    RDW 19.8 (*)    All other components within normal limits  RESP PANEL BY RT-PCR (FLU A&B, COVID) ARPGX2  LIPASE, BLOOD  URINALYSIS, ROUTINE W REFLEX  MICROSCOPIC  I-STAT BETA HCG BLOOD, ED ([facility], [facility], [facility] ONLY)    EKG None  Radiology No results found.  Procedures Procedures    Medications Ordered in ED Medications - No data to display  ED Course/ Medical Decision Making/ A&P                           Medical Decision Making  Is an overall appearing patient who presents with 3 days of weakness, with a few isolated events of nausea, vomiting, and diarrhea.  Patient reports that she has had a few recent sick contacts at work.  She denies any cough, sore throat, fever, chills.  She denies any blood in her emesis or diarrhea.  My differential diagnosis includes symptomatic anemia, gastroenteritis, electrolyte abnormality, Versus other acute intra-abdominal process.  This is not an exhaustive differential.  I have obtained and personally reviewed lab work including CBC, CMP, lipase, beta-hCG.  Lab work is remarkable for hemoglobin of 8.7 with microcytic cell size, consistent with iron deficiency anemia.  She has a mild hypokalemia, mild hypocalcemia.  She has normal lipase, undetectable beta-hCG.  She declines urinalysis at this time.  Given her nausea, vomiting, diarrhea will obtain a respiratory virus panel, however as we plan to dispo patient at this time we discussed that she can check her results on her portal.  She has stable vital signs, clear breath sounds, no report of chest pain, shortness of breath.  She is not tachycardic with her anemia.  We discussed iron supplementation, B12 supplementation.  I also recommend that she increase her vitamin D supplementation.  Discussed that she can use Imodium for her diarrhea if she would like, but the most important thing is to increase fluid intake, rest.  Patient discharged in stable condition at this time, return precautions given. Final Clinical Impression(s) / ED Diagnoses Final diagnoses:  Iron deficiency anemia, unspecified iron deficiency anemia type  Nausea vomiting and diarrhea     Rx / DC Orders ED Discharge Orders          Ordered    ondansetron (ZOFRAN) 4 MG tablet  Every 6 hours        10/14/21 1958              Demetres Prochnow, Edyth Gunnels 10/14/21 2124    Mancel Bale, MD 10/15/21 [phone]

## 2021-10-14 NOTE — ED Triage Notes (Signed)
3 days of feeling tired, N/V/D.

## 2021-10-14 NOTE — ED Notes (Signed)
RN went to ask patient to provide urine sample. Patient said "I am ready to go home, I have been sitting too long." [place], [place] notified.

## 2021-10-14 NOTE — ED Notes (Signed)
Pt declined IV placement asking to wait for her to see EDP.

## 2021-12-20 DIAGNOSIS — R946 Abnormal results of thyroid function studies: Secondary | ICD-10-CM | POA: Diagnosis not present

## 2021-12-26 DIAGNOSIS — R946 Abnormal results of thyroid function studies: Secondary | ICD-10-CM | POA: Diagnosis not present

## 2021-12-26 DIAGNOSIS — Z0000 Encounter for general adult medical examination without abnormal findings: Secondary | ICD-10-CM | POA: Diagnosis not present

## 2021-12-26 DIAGNOSIS — R7309 Other abnormal glucose: Secondary | ICD-10-CM | POA: Diagnosis not present

## 2022-03-30 DIAGNOSIS — R0982 Postnasal drip: Secondary | ICD-10-CM | POA: Diagnosis not present

## 2022-03-30 DIAGNOSIS — R590 Localized enlarged lymph nodes: Secondary | ICD-10-CM | POA: Diagnosis not present

## 2022-03-30 DIAGNOSIS — R070 Pain in throat: Secondary | ICD-10-CM | POA: Diagnosis not present

## 2022-10-09 DIAGNOSIS — R051 Acute cough: Secondary | ICD-10-CM | POA: Diagnosis not present

## 2022-10-09 DIAGNOSIS — R0982 Postnasal drip: Secondary | ICD-10-CM | POA: Diagnosis not present

## 2022-10-09 DIAGNOSIS — J019 Acute sinusitis, unspecified: Secondary | ICD-10-CM | POA: Diagnosis not present

## 2022-10-09 DIAGNOSIS — R430 Anosmia: Secondary | ICD-10-CM | POA: Diagnosis not present

## 2022-11-19 DIAGNOSIS — M549 Dorsalgia, unspecified: Secondary | ICD-10-CM | POA: Diagnosis not present

## 2022-12-24 DIAGNOSIS — R946 Abnormal results of thyroid function studies: Secondary | ICD-10-CM | POA: Diagnosis not present

## 2022-12-24 DIAGNOSIS — R7309 Other abnormal glucose: Secondary | ICD-10-CM | POA: Diagnosis not present

## 2022-12-24 DIAGNOSIS — Z0000 Encounter for general adult medical examination without abnormal findings: Secondary | ICD-10-CM | POA: Diagnosis not present

## 2022-12-31 DIAGNOSIS — D649 Anemia, unspecified: Secondary | ICD-10-CM | POA: Diagnosis not present

## 2022-12-31 DIAGNOSIS — K429 Umbilical hernia without obstruction or gangrene: Secondary | ICD-10-CM | POA: Diagnosis not present

## 2022-12-31 DIAGNOSIS — L309 Dermatitis, unspecified: Secondary | ICD-10-CM | POA: Diagnosis not present

## 2022-12-31 DIAGNOSIS — Z0000 Encounter for general adult medical examination without abnormal findings: Secondary | ICD-10-CM | POA: Diagnosis not present

## 2023-01-28 DIAGNOSIS — K429 Umbilical hernia without obstruction or gangrene: Secondary | ICD-10-CM | POA: Diagnosis not present

## 2023-01-28 DIAGNOSIS — D649 Anemia, unspecified: Secondary | ICD-10-CM | POA: Diagnosis not present

## 2023-04-19 DIAGNOSIS — N898 Other specified noninflammatory disorders of vagina: Secondary | ICD-10-CM | POA: Diagnosis not present

## 2023-04-19 DIAGNOSIS — L729 Follicular cyst of the skin and subcutaneous tissue, unspecified: Secondary | ICD-10-CM | POA: Diagnosis not present

## 2023-04-23 DIAGNOSIS — A6000 Herpesviral infection of urogenital system, unspecified: Secondary | ICD-10-CM | POA: Diagnosis not present

## 2023-04-23 DIAGNOSIS — L731 Pseudofolliculitis barbae: Secondary | ICD-10-CM | POA: Diagnosis not present

## 2023-07-18 DIAGNOSIS — N760 Acute vaginitis: Secondary | ICD-10-CM | POA: Diagnosis not present

## 2023-08-06 DIAGNOSIS — Z113 Encounter for screening for infections with a predominantly sexual mode of transmission: Secondary | ICD-10-CM | POA: Diagnosis not present

## 2023-08-08 LAB — LAB REPORT - SCANNED
A1c: 5.5
EGFR: 103

## 2023-10-23 ENCOUNTER — Telehealth: Payer: Self-pay | Admitting: Family Medicine

## 2023-10-23 NOTE — Telephone Encounter (Signed)
 Copied from CRM [id number]. Topic: Appointments - Transfer of Care >> Oct 23, 2023  4:03 PM Lovett Ruck C wrote: Pt is requesting to transfer FROM: Pete Brand, DO Pt is requesting to transfer TO: [facility] (specifically Dr. Cinda Craze if possible) Reason for requested transfer: none given It is the responsibility of the team the patient would like to transfer to (Dr. Arliss Lam) to reach out to the patient if for any reason this transfer is not acceptable.

## 2023-11-22 ENCOUNTER — Ambulatory Visit (INDEPENDENT_AMBULATORY_CARE_PROVIDER_SITE_OTHER): Payer: Commercial Managed Care - PPO | Admitting: Nurse Practitioner

## 2023-11-22 VITALS — BP 108/80 | Ht 63.0 in | Wt 157.0 lb

## 2023-11-22 DIAGNOSIS — Z113 Encounter for screening for infections with a predominantly sexual mode of transmission: Secondary | ICD-10-CM | POA: Diagnosis not present

## 2023-11-22 DIAGNOSIS — M79642 Pain in left hand: Secondary | ICD-10-CM

## 2023-11-22 DIAGNOSIS — E663 Overweight: Secondary | ICD-10-CM

## 2023-11-22 DIAGNOSIS — Z131 Encounter for screening for diabetes mellitus: Secondary | ICD-10-CM | POA: Insufficient documentation

## 2023-11-22 DIAGNOSIS — D509 Iron deficiency anemia, unspecified: Secondary | ICD-10-CM | POA: Insufficient documentation

## 2023-11-22 DIAGNOSIS — Z136 Encounter for screening for cardiovascular disorders: Secondary | ICD-10-CM

## 2023-11-22 DIAGNOSIS — J351 Hypertrophy of tonsils: Secondary | ICD-10-CM | POA: Diagnosis not present

## 2023-11-22 DIAGNOSIS — Z13220 Encounter for screening for lipoid disorders: Secondary | ICD-10-CM | POA: Diagnosis not present

## 2023-11-22 LAB — URINALYSIS, ROUTINE W REFLEX MICROSCOPIC
Bilirubin Urine: NEGATIVE
Hgb urine dipstick: NEGATIVE
Ketones, ur: NEGATIVE
Leukocytes,Ua: NEGATIVE
Nitrite: NEGATIVE
RBC / HPF: NONE SEEN (ref 0–?)
Specific Gravity, Urine: 1.01 (ref 1.000–1.030)
Total Protein, Urine: NEGATIVE
Urine Glucose: NEGATIVE
Urobilinogen, UA: 0.2 (ref 0.0–1.0)
pH: 6 (ref 5.0–8.0)

## 2023-11-22 LAB — TSH: TSH: 1.69 u[IU]/mL (ref 0.35–5.50)

## 2023-11-22 LAB — COMPREHENSIVE METABOLIC PANEL
ALT: 27 U/L (ref 0–35)
AST: 22 U/L (ref 0–37)
Albumin: 4.5 g/dL (ref 3.5–5.2)
Alkaline Phosphatase: 50 U/L (ref 39–117)
BUN: 11 mg/dL (ref 6–23)
CO2: 25 meq/L (ref 19–32)
Calcium: 9.2 mg/dL (ref 8.4–10.5)
Chloride: 105 meq/L (ref 96–112)
Creatinine, Ser: 0.74 mg/dL (ref 0.40–1.20)
GFR: 99.12 mL/min (ref >60.00)
Glucose, Bld: 81 mg/dL (ref 70–99)
Potassium: 3.5 meq/L (ref 3.5–5.1)
Sodium: 136 meq/L (ref 135–145)
Total Bilirubin: 0.4 mg/dL (ref 0.2–1.2)
Total Protein: 8.5 g/dL — ABNORMAL HIGH (ref 6.0–8.3)

## 2023-11-22 LAB — IRON: Iron: 14 ug/dL — ABNORMAL LOW (ref 42–145)

## 2023-11-22 LAB — HEMOGLOBIN A1C: Hgb A1c MFr Bld: 5.4 % (ref 4.6–6.5)

## 2023-11-22 LAB — LIPID PANEL
Cholesterol: 110 mg/dL (ref 0–200)
HDL: 41.3 mg/dL (ref >39.00)
LDL Cholesterol: 61 mg/dL (ref 0–99)
NonHDL: 68.21
Total CHOL/HDL Ratio: 3
Triglycerides: 38 mg/dL (ref 0.0–149.0)
VLDL: 7.6 mg/dL (ref 0.0–40.0)

## 2023-11-22 LAB — CBC
HCT: 30.7 % — ABNORMAL LOW (ref 36.0–46.0)
Hemoglobin: 9.3 g/dL — ABNORMAL LOW (ref 12.0–15.0)
MCHC: 30.4 g/dL (ref 30.0–36.0)
MCV: 74.3 fL — ABNORMAL LOW (ref 78.0–100.0)
Platelets: 302 10*3/uL (ref 150.0–400.0)
RBC: 4.14 Mil/uL (ref 3.87–5.11)
RDW: 21.7 % — ABNORMAL HIGH (ref 11.5–15.5)
WBC: 6.1 10*3/uL (ref 4.0–10.5)

## 2023-11-22 LAB — FERRITIN: Ferritin: 7.1 ng/mL — ABNORMAL LOW (ref 10.0–291.0)

## 2023-11-22 NOTE — Assessment & Plan Note (Signed)
Labs ordered, further recommendations may be made based upon these results

## 2023-11-22 NOTE — Assessment & Plan Note (Signed)
Chronic, etiology unclear.  Right > Left No difficulty with breathing, no evidence abscess, uvula midline, no pain, afebrile.  Refer to ENT urgently.

## 2023-11-22 NOTE — Assessment & Plan Note (Signed)
Labs ordered, further recommendations may be made based upon his results.

## 2023-11-22 NOTE — Assessment & Plan Note (Signed)
Labs ordered, further recommendations may be made based upon his results Discussed referral to GI as well to undergo colonoscopy for further evaluation of her chronic IDA, she declines at this time.

## 2023-11-22 NOTE — Progress Notes (Signed)
New Patient Office Visit  Subjective    Patient ID: Colleen Fisher, female    DOB: 1981-09-04  Age: 43 y.o. MRN: [id number]  CC:  Chief Complaint  Patient presents with   New Patient (Initial Visit)    Tonsilis     HPI Colleen Fisher presents to establish care Has not been to PCP routinely for a few years.  Her main concern is enlarged tonsil on the right side, and left hand pain. Large tonsil: Went to urgent care couple weeks ago with tonsils enlarged. Per patient she has not had discomfort.  She did report chills, but this has subsided.  She reports that they tested her for strep with reflexive culture, all testing was negative.  She was treated course of amoxicillin.  Today, she feels that her right tonsil still enlarged, but denies pain or difficulty breathing.  Denies snoring. Denies unintentional weight loss.  Reports that she has felt her right tonsil being more enlarged than her left chronically, has not seen specialist for evaluation as of yet. Has left hand pain that is intermittent.  No sensory changes, no weakness per patient.  Located between first and second digit.  No obvious triggers.  Has not used any at home treatments as of yet.  He does work on a Animator which requires typing frequently.    Outpatient Encounter Medications as of 11/22/2023  Medication Sig   valACYclovir (VALTREX) 500 MG tablet Take 500 mg by mouth 2 (two) times daily.   [DISCONTINUED] ondansetron (ZOFRAN) 4 MG tablet Take 1 tablet (4 mg total) by mouth every 6 (six) hours.   [DISCONTINUED] Prenatal Vit-Fe Fumarate-FA (PRENATAL MULTIVITAMIN) TABS tablet Take 1 tablet by mouth daily at 12 noon.   [DISCONTINUED] valACYclovir (VALTREX) 500 MG tablet Take 500 mg by mouth daily.    ibuprofen (ADVIL,MOTRIN) 600 MG tablet Take 1 tablet (600 mg total) by mouth every 6 (six) hours as needed. (Patient not taking: Reported on 11/22/2023)   No facility-administered encounter medications on file as of 11/22/2023.     Past Medical History:  Diagnosis Date   Anemia    Blood transfusion without reported diagnosis    Hx of chlamydia infection    SVD (spontaneous vaginal delivery) 10/15/2018   Uterine atony, postpartum, current hospitalization 10/15/2018   Vaginal Pap smear, abnormal     Past Surgical History:  Procedure Laterality Date   ADENOIDECTOMY     COLPOSCOPY     ENDOMETRIAL BIOPSY      No family history on file.  Social History   Socioeconomic History   Marital status: Single    Spouse name: Not on file   Number of children: Not on file   Years of education: Not on file   Highest education level: Not on file  Occupational History   Not on file  Tobacco Use   Smoking status: Never   Smokeless tobacco: Never  Vaping Use   Vaping status: Never Used  Substance and Sexual Activity   Alcohol use: Never   Drug use: Never   Sexual activity: Yes    Birth control/protection: None  Other Topics Concern   Not on file  Social History Narrative   Not on file   Social Drivers of Health   Financial Resource Strain: Low Risk  (10/15/2018)   Overall Financial Resource Strain (CARDIA)    Difficulty of Paying Living Expenses: Not hard at all  Food Insecurity: No Food Insecurity (10/15/2018)   Hunger Vital Sign  Worried About Programme researcher, broadcasting/film/video in the Last Year: Never true    Ran Out of Food in the Last Year: Never true  Transportation Needs: No Transportation Needs (10/15/2018)   PRAPARE - Administrator, Civil Service (Medical): No    Lack of Transportation (Non-Medical): No  Physical Activity: Inactive (10/15/2018)   Exercise Vital Sign    Days of Exercise per Week: 0 days    Minutes of Exercise per Session: 0 min  Stress: No Stress Concern Present (10/15/2018)   [employer] of Occupational Health - Occupational Stress Questionnaire    Feeling of Stress : Not at all  Social Connections: Unknown (10/15/2018)   Social Connection and Isolation Panel [NHANES]    Frequency  of Communication with Friends and Family: More than three times a week    Frequency of Social Gatherings with Friends and Family: More than three times a week    Attends Religious Services: Not on Marketing executive or Organizations: Not on file    Attends Banker Meetings: Not on file    Marital Status: Not on file  Intimate Partner Violence: Not on file    ROS: see HPI      Objective    BP 108/80   Ht 5\' 3"  (1.6 m)   Wt 157 lb (71.2 kg)   SpO2 98%   BMI 27.81 kg/m   Physical Exam Vitals reviewed.  Constitutional:      General: She is not in acute distress.    Appearance: Normal appearance.  HENT:     Head: Normocephalic and atraumatic.     Mouth/Throat:     Pharynx: Oropharynx is clear. Uvula midline. No pharyngeal swelling, oropharyngeal exudate, posterior oropharyngeal erythema or uvula swelling.     Tonsils: No tonsillar exudate or tonsillar abscesses. 3+ on the right. 2+ on the left.  Neck:     Vascular: No carotid bruit.  Cardiovascular:     Rate and Rhythm: Normal rate and regular rhythm.     Pulses: Normal pulses.     Heart sounds: Normal heart sounds.  Pulmonary:     Effort: Pulmonary effort is normal.     Breath sounds: Normal breath sounds.  Skin:    General: Skin is warm and dry.  Neurological:     General: No focal deficit present.     Mental Status: She is alert and oriented to person, place, and time.  Psychiatric:        Mood and Affect: Mood normal.        Behavior: Behavior normal.        Judgment: Judgment normal.         Assessment & Plan:   Problem List Items Addressed This Visit       Other   Enlarged tonsils - Primary   Chronic, etiology unclear.  Right > Left No difficulty with breathing, no evidence abscess, uvula midline, no pain, afebrile.  Refer to ENT urgently.       Relevant Orders   CBC   Comprehensive metabolic panel   Hemoglobin A1c   Lipid panel   TSH   Iron   Ferritin    Ambulatory referral to ENT   Iron deficiency anemia   Labs ordered, further recommendations may be made based upon his results Discussed referral to GI as well to undergo colonoscopy for further evaluation of her chronic IDA, she declines at this time.  Relevant Orders   CBC   Comprehensive metabolic panel   Hemoglobin A1c   Lipid panel   TSH   Iron   Ferritin   Diabetes mellitus screening   Labs ordered, further recommendations may be made based upon these results       Relevant Orders   CBC   Comprehensive metabolic panel   Hemoglobin A1c   Lipid panel   TSH   Iron   Ferritin   HIV Antibody (routine testing w rflx)   RPR   Chlamydia/Neisseria Gonorrhoeae RNA,TMA,Urogenital   Urinalysis, Routine w reflex microscopic   Encounter for lipid screening for cardiovascular disease   Labs ordered, further recommendations may be made based upon his results       Relevant Orders   CBC   Comprehensive metabolic panel   Hemoglobin A1c   Lipid panel   TSH   Iron   Ferritin   Overweight   Labs ordered, further recommendations may be made based upon his results       Relevant Orders   CBC   Comprehensive metabolic panel   Hemoglobin A1c   Lipid panel   TSH   Iron   Ferritin   Left hand pain   Etiology unclear, possibly tendinitis.  For now treat with over-the-counter Tylenol and ibuprofen per package instructions as needed.  Will also get x-ray, further recommendations may be made based upon these results.      Relevant Orders   DG Hand Complete Left   Routine screening for STI (sexually transmitted infection)   Labs ordered, further recommendations may be made based upon his results       Relevant Orders   HIV Antibody (routine testing w rflx)   RPR   Chlamydia/Neisseria Gonorrhoeae RNA,TMA,Urogenital   Urinalysis, Routine w reflex microscopic   hCG, serum, qualitative    Return for as scheduled.   Elenore Paddy, NP

## 2023-11-22 NOTE — Assessment & Plan Note (Signed)
Etiology unclear, possibly tendinitis.  For now treat with over-the-counter Tylenol and ibuprofen per package instructions as needed.  Will also get x-ray, further recommendations may be made based upon these results.

## 2023-11-23 LAB — RPR: RPR Ser Ql: NONREACTIVE

## 2023-11-23 LAB — HIV ANTIBODY (ROUTINE TESTING W REFLEX): HIV 1&2 Ab, 4th Generation: NONREACTIVE

## 2023-11-24 LAB — CHLAMYDIA/NEISSERIA GONORRHOEAE RNA,TMA,UROGENTIAL
C. trachomatis RNA, TMA: NOT DETECTED
N. gonorrhoeae RNA, TMA: NOT DETECTED

## 2023-11-24 LAB — HCG, SERUM, QUALITATIVE: Preg, Serum: NEGATIVE

## 2023-11-25 ENCOUNTER — Other Ambulatory Visit: Payer: Self-pay | Admitting: Nurse Practitioner

## 2023-11-25 ENCOUNTER — Encounter: Payer: Self-pay | Admitting: Nurse Practitioner

## 2023-11-25 DIAGNOSIS — R779 Abnormality of plasma protein, unspecified: Secondary | ICD-10-CM

## 2023-12-04 ENCOUNTER — Ambulatory Visit (INDEPENDENT_AMBULATORY_CARE_PROVIDER_SITE_OTHER): Payer: 59

## 2023-12-04 ENCOUNTER — Ambulatory Visit (INDEPENDENT_AMBULATORY_CARE_PROVIDER_SITE_OTHER): Payer: 59 | Admitting: Nurse Practitioner

## 2023-12-04 VITALS — BP 110/68 | HR 63 | Temp 98.3°F | Ht 63.0 in | Wt 155.0 lb

## 2023-12-04 DIAGNOSIS — R779 Abnormality of plasma protein, unspecified: Secondary | ICD-10-CM | POA: Diagnosis not present

## 2023-12-04 DIAGNOSIS — Z0001 Encounter for general adult medical examination with abnormal findings: Secondary | ICD-10-CM | POA: Insufficient documentation

## 2023-12-04 DIAGNOSIS — M79642 Pain in left hand: Secondary | ICD-10-CM | POA: Diagnosis not present

## 2023-12-04 LAB — HM MAMMOGRAPHY

## 2023-12-04 NOTE — Assessment & Plan Note (Addendum)
 Discussed healthy lifestyle, as well as screening.  She will plan on getting Pap smear done with OB/GYN at appointment next month.  Reports having had mammogram completed since I saw her last, she had it done at [facility] she told them to fax results to myself.  Waiting to have these results to be reviewed.  Handout provided. Patient is open to having Tdap administered if she needs booster, still waiting on previous medical records for review to determine if booster is due.

## 2023-12-04 NOTE — Assessment & Plan Note (Signed)
 Incidental finding Follow-up labs pending, further recommendations may be made based upon his results.

## 2023-12-04 NOTE — Progress Notes (Signed)
 Complete physical exam  Patient: Colleen Fisher   DOB: [birth year]   43 y.o. Female  MRN: [id number]  Subjective:    Chief Complaint  Patient presents with   Annual Exam    Colleen Fisher is a 43 y.o. female who presents today for a complete physical exam. She reports consuming a general diet.  Exercise: none currently.  She does have additional problems to discuss today.   High Serum Protein: Incidental finding on labs, serum protein of 8.5.  Patient has had SPEP and UPEP collected, awaiting results.  Iron deficiency anemia: Chronic, last hemoglobin 9.3 with iron level of 14 and ferritin is 7.1.  Encouraged patient to start taking an iron supplement, she reports she started taking 1 but has been somewhat inconsistent.  Most recent fall risk assessment:    12/04/2023    2:31 PM  Fall Risk   Falls in the past year? 0  Number falls in past yr: 0  Injury with Fall? 0  Risk for fall due to : No Fall Risks  Follow up Falls evaluation completed     Most recent depression screenings:    12/04/2023    2:31 PM 11/22/2023   11:06 AM  PHQ 2/9 Scores  PHQ - 2 Score 0 0  PHQ- 9 Score 0 2         Patient Care Team: Elenore Paddy, NP as PCP - General (Nurse Practitioner)   Outpatient Medications Prior to Visit  Medication Sig   ferrous sulfate 325 (65 FE) MG EC tablet Take 325 mg by mouth daily with breakfast.   ibuprofen (ADVIL,MOTRIN) 600 MG tablet Take 1 tablet (600 mg total) by mouth every 6 (six) hours as needed.   psyllium (METAMUCIL) 58.6 % packet Take 1 packet by mouth daily.   valACYclovir (VALTREX) 500 MG tablet Take 500 mg by mouth 2 (two) times daily.   No facility-administered medications prior to visit.    Review of Systems  Constitutional:  Negative for chills and fever.  HENT:  Positive for congestion. Negative for sore throat.        (+) enlarged tonsils  Eyes:  Negative for blurred vision and double vision.       (+) near sighted, wears glasses   Respiratory:  Negative for shortness of breath and wheezing.   Cardiovascular:  Negative for chest pain and palpitations.  Gastrointestinal:  Negative for abdominal pain and blood in stool.  Genitourinary:  Negative for hematuria.  Skin:  Negative for itching and rash.  Neurological:  Negative for seizures and loss of consciousness.  Psychiatric/Behavioral:  Negative for depression and suicidal ideas.           Objective:     BP 110/68   Pulse 63   Temp 98.3 F (36.8 C) (Temporal)   Ht 5\' 3"  (1.6 m)   Wt 155 lb (70.3 kg)   LMP 11/28/2023   SpO2 96%   BMI 27.46 kg/m    Physical Exam Vitals reviewed. Exam conducted with a chaperone present.  Constitutional:      Appearance: Normal appearance.  HENT:     Head: Normocephalic and atraumatic.     Right Ear: Tympanic membrane, ear canal and external ear normal.     Left Ear: Tympanic membrane, ear canal and external ear normal.  Eyes:     General:        Right eye: No discharge.        Left eye: No discharge.  Extraocular Movements: Extraocular movements intact.     Conjunctiva/sclera: Conjunctivae normal.     Pupils: Pupils are equal, round, and reactive to light.  Neck:     Vascular: No carotid bruit.  Cardiovascular:     Rate and Rhythm: Normal rate and regular rhythm.     Pulses: Normal pulses.     Heart sounds: Normal heart sounds. No murmur heard. Pulmonary:     Effort: Pulmonary effort is normal.     Breath sounds: Normal breath sounds.  Chest:  Breasts:    Breasts are symmetrical.     Right: Normal.     Left: Normal.  Abdominal:     General: Abdomen is flat. Bowel sounds are normal. There is no distension.     Palpations: Abdomen is soft. There is no mass.     Tenderness: There is no abdominal tenderness.  Musculoskeletal:        General: No tenderness.     Cervical back: Neck supple. No muscular tenderness.     Right lower leg: No edema.     Left lower leg: No edema.  Lymphadenopathy:      Cervical: No cervical adenopathy.     Upper Body:     Right upper body: No supraclavicular adenopathy.     Left upper body: No supraclavicular adenopathy.  Skin:    General: Skin is warm and dry.  Neurological:     General: No focal deficit present.     Mental Status: She is alert and oriented to person, place, and time.     Motor: No weakness.     Gait: Gait normal.  Psychiatric:        Mood and Affect: Mood normal.        Behavior: Behavior normal.        Judgment: Judgment normal.      No results found for any visits on 12/04/23.     Assessment & Plan:    Routine Health Maintenance and Physical Exam  Immunization History  Administered Date(s) Administered   PFIZER(Purple Top)SARS-COV-2 Vaccination 02/25/2020, 03/24/2020    Health Maintenance  Topic Date Due   Hepatitis C Screening  Never done   DTaP/Tdap/Td (1 - Tdap) Never done   Cervical Cancer Screening (HPV/Pap Cotest)  Never done   COVID-19 Vaccine (3 - 2024-25 season) 06/09/2023   INFLUENZA VACCINE  01/06/2024 (Originally 05/09/2023)   HIV Screening  Completed   HPV VACCINES  Aged Out    Discussed health benefits of physical activity, and encouraged her to engage in regular exercise appropriate for her age and condition.  Problem List Items Addressed This Visit       Other   High serum protein level   Incidental finding Follow-up labs pending, further recommendations may be made based upon his results.      Encounter for general adult medical examination with abnormal findings - Primary   Discussed healthy lifestyle, as well as screening.  She will plan on getting Pap smear done with OB/GYN at appointment next month.  Reports having had mammogram completed since I saw her last, she had it done at [facility] she told them to fax results to myself.  Waiting to have these results to be reviewed.  Handout provided. Patient is open to having Tdap administered if she needs booster, still waiting on previous medical  records for review to determine if booster is due.      Return in about 7 months (around 07/03/2024) for F/U with Maralyn Sago.  Elenore Paddy, NP

## 2023-12-05 ENCOUNTER — Encounter: Payer: Self-pay | Admitting: Nurse Practitioner

## 2023-12-06 LAB — PROTEIN ELECTROPHORESIS, SERUM
Albumin ELP: 4 g/dL (ref 3.8–4.8)
Alpha 1: 0.1 g/dL — ABNORMAL LOW (ref 0.2–0.3)
Alpha 2: 0.7 g/dL (ref 0.5–0.9)
Beta 2: 0.5 g/dL (ref 0.2–0.5)
Beta Globulin: 0.5 g/dL (ref 0.4–0.6)
Gamma Globulin: 1.7 g/dL (ref 0.8–1.7)
Total Protein: 7.6 g/dL (ref 6.1–8.1)

## 2023-12-09 LAB — PROTEIN ELECTROPHORESIS, URINE REFLEX
Albumin ELP, Urine: 35.3 %
Alpha-1-Globulin, U: 1.4 %
Alpha-2-Globulin, U: 12.9 %
Beta Globulin, U: 30.7 %
Gamma Globulin, U: 19.7 %
Protein, Ur: 11.7 mg/dL

## 2023-12-12 ENCOUNTER — Encounter: Payer: Self-pay | Admitting: Nurse Practitioner

## 2023-12-13 ENCOUNTER — Encounter: Payer: Self-pay | Admitting: Nurse Practitioner

## 2023-12-30 ENCOUNTER — Telehealth: Payer: Self-pay | Admitting: Otolaryngology

## 2023-12-30 NOTE — Telephone Encounter (Signed)
 Called 12-30-23, left a VM, Dr. will not be in office

## 2024-01-17 ENCOUNTER — Institutional Professional Consult (permissible substitution) (INDEPENDENT_AMBULATORY_CARE_PROVIDER_SITE_OTHER): Payer: 59 | Admitting: Otolaryngology

## 2024-01-29 ENCOUNTER — Encounter (INDEPENDENT_AMBULATORY_CARE_PROVIDER_SITE_OTHER): Payer: Self-pay

## 2024-01-29 ENCOUNTER — Ambulatory Visit (INDEPENDENT_AMBULATORY_CARE_PROVIDER_SITE_OTHER): Admitting: Otolaryngology

## 2024-01-29 VITALS — BP 114/73 | HR 78 | Ht 63.0 in | Wt 155.0 lb

## 2024-01-29 DIAGNOSIS — J351 Hypertrophy of tonsils: Secondary | ICD-10-CM

## 2024-01-29 DIAGNOSIS — J0391 Acute recurrent tonsillitis, unspecified: Secondary | ICD-10-CM

## 2024-01-29 NOTE — Progress Notes (Signed)
 Dear Dr. Martina Sledge, Here is my assessment for our mutual patient, Colleen Fisher. Thank you for allowing me the opportunity to care for your patient. Please do not hesitate to contact me should you have any other questions. Sincerely, Dr. Milon Aloe  Otolaryngology Clinic Note Referring provider: Dr. Martina Sledge HPI:  Colleen Fisher is a 43 y.o. female kindly referred by Dr. Martina Sledge for evaluation of tonsillar hypertrophy  Initial visit (01/2024): Patient reports: reports she has had strep throat before, but in February, she noted there were some patches on it and went to [facility]. Diagnosed with pharyngitis, prescribed antibiotics, seen by PCP who noted some asymmetry on right and referred here. She reports she gets sore throat perhaps once a year -- typically does get antibiotics for it with improvement. No frequent tonsil stones. No prior PTA. No fevers/night sweats/weight loss.   Patient otherwise denies: - dysphagia, odynophagia, PNA - changes in voice, shortness of breath, hemoptysis - ear pain, neck masses  H&N Surgery: denies - possible adenoidectomy(?) but does not remember Personal or FHx of bleeding dz or anesthesia difficulty: no  Tobacco: no  PMHx: Anemia Independent Review of Additional Tests or Records:  Colleen Fisher (11/22/2023): Tonsil hypertrophy (particularly on right), seen in UC but no discomfort; treated with amoxicillin, no other sx; this is chronic; no sx, enlarged tonsils - ref to ENT UC notes ([facility]) reviewed and uploaded or available in chart in media tab - 11/04/2023: sore throat, but severity (0/10?), Dx: Sore throat; Rx: Augmentin; Cefdinir for acute cough 10/10/2023 Labs CBC and CMP 11/22/2023: WBC 6.1, Hgb 9.3, Plt 302; CMP shows BUN/Cr 11/0.74, no significant EL abnormalities Rapid strep 11/04/2023: negative  PMH/Meds/All/SocHx/FamHx/ROS:   Past Medical History:  Diagnosis Date   Anemia    Blood transfusion without reported diagnosis    Hx of chlamydia infection    SVD  (spontaneous vaginal delivery) 10/15/2018   Uterine atony, postpartum, current hospitalization 10/15/2018   Vaginal Pap smear, abnormal      Past Surgical History:  Procedure Laterality Date   ADENOIDECTOMY     COLPOSCOPY     ENDOMETRIAL BIOPSY      History reviewed. No pertinent family history.   Social Connections: Unknown (10/15/2018)   Social Connection and Isolation Panel [NHANES]    Frequency of Communication with Friends and Family: More than three times a week    Frequency of Social Gatherings with Friends and Family: More than three times a week    Attends Religious Services: Not on Marketing executive or Organizations: Not on file    Attends Banker Meetings: Not on file    Marital Status: Not on file      Current Outpatient Medications:    ferrous sulfate 325 (65 FE) MG EC tablet, Take 325 mg by mouth daily with breakfast., Disp: , Rfl:    ibuprofen  (ADVIL ,MOTRIN ) 600 MG tablet, Take 1 tablet (600 mg total) by mouth every 6 (six) hours as needed., Disp: 40 tablet, Rfl: 1   psyllium (METAMUCIL) 58.6 % packet, Take 1 packet by mouth daily., Disp: , Rfl:    valACYclovir (VALTREX) 500 MG tablet, Take 500 mg by mouth 2 (two) times daily., Disp: , Rfl:    Physical Exam:   BP 114/73 (BP Location: Right Arm, Patient Position: Sitting, Cuff Size: Normal)   Pulse 78   Ht 5\' 3"  (1.6 m)   Wt 155 lb (70.3 kg)   SpO2 97%   BMI 27.46 kg/m  Salient findings:  CN II-XII intact Bilateral EAC clear and TM intact with well pneumatized middle ear spaces Anterior rhinoscopy: Septum relatively midline; bilateral inferior turbinates without significant hypertrophy No lesions of oral cavity/oropharynx; right tonsil 2+, left 2 (right more medialized, left bigger more anterior-posterior); normal in appearance, no large stones No obviously palpable neck masses/lymphadenopathy/thyromegaly No respiratory distress or stridor  Seprately Identifiable Procedures:   None  Impression & Plans:  Loraine Bhullar is a 43 y.o. female with:  1. Tonsillar hypertrophy   2. Recurrent tonsillitis    Noted incidentally, no significant symptoms except for intermittent tonsillitis; there is some asymmetry but tonsils look normal in appearance. Not having significantly frequent tonsil infections to warrant tonsillectomy. Discussed options including tonsillectomy for asymmetry v/s observation; patient opted observation We did discuss return precautions including neck mass development, B symptoms or growth or other symptoms.   F/u 1 year  See below regarding exact medications prescribed this encounter including dosages and route: No orders of the defined types were placed in this encounter.     Thank you for allowing me the opportunity to care for your patient. Please do not hesitate to contact me should you have any other questions.  Sincerely, Milon Aloe, MD Otolaryngologist (ENT), [facility] Phone: [phone] Fax: [phone]  01/29/2024, 12:43 PM   I have personally spent 48 minutes involved in face-to-face and non-face-to-face activities for this patient on the day of the visit.  Professional time spent excludes any procedures performed but includes the following activities, in addition to those noted in the documentation: preparing to see the patient (review of outside documentation and results), performing a medically appropriate examination, counseling, documenting in the electronic health record

## 2024-04-16 ENCOUNTER — Inpatient Hospital Stay

## 2024-04-16 ENCOUNTER — Inpatient Hospital Stay: Attending: Oncology | Admitting: Oncology

## 2024-04-16 ENCOUNTER — Encounter: Payer: Self-pay | Admitting: Oncology

## 2024-04-16 VITALS — BP 102/63 | HR 71 | Temp 97.7°F | Resp 16 | Ht 64.0 in | Wt 152.2 lb

## 2024-04-16 DIAGNOSIS — D509 Iron deficiency anemia, unspecified: Secondary | ICD-10-CM | POA: Insufficient documentation

## 2024-04-16 DIAGNOSIS — D500 Iron deficiency anemia secondary to blood loss (chronic): Secondary | ICD-10-CM

## 2024-04-16 DIAGNOSIS — Z79624 Long term (current) use of inhibitors of nucleotide synthesis: Secondary | ICD-10-CM | POA: Diagnosis not present

## 2024-04-16 LAB — CMP (CANCER CENTER ONLY)
ALT: 22 U/L (ref 0–44)
AST: 24 U/L (ref 15–41)
Albumin: 4.3 g/dL (ref 3.5–5.0)
Alkaline Phosphatase: 43 U/L (ref 38–126)
Anion gap: 4 — ABNORMAL LOW (ref 5–15)
BUN: 13 mg/dL (ref 6–20)
CO2: 27 mmol/L (ref 22–32)
Calcium: 9.1 mg/dL (ref 8.9–10.3)
Chloride: 107 mmol/L (ref 98–111)
Creatinine: 0.83 mg/dL (ref 0.44–1.00)
GFR, Estimated: >60 mL/min (ref >60)
Glucose, Bld: 91 mg/dL (ref 70–99)
Potassium: 3.6 mmol/L (ref 3.5–5.1)
Sodium: 138 mmol/L (ref 135–145)
Total Bilirubin: 0.4 mg/dL (ref 0.0–1.2)
Total Protein: 8.2 g/dL — ABNORMAL HIGH (ref 6.5–8.1)

## 2024-04-16 LAB — CBC WITH DIFFERENTIAL (CANCER CENTER ONLY)
Abs Immature Granulocytes: 0.01 K/uL (ref 0.00–0.07)
Basophils Absolute: 0.1 K/uL (ref 0.0–0.1)
Basophils Relative: 1 %
Eosinophils Absolute: 0.1 K/uL (ref 0.0–0.5)
Eosinophils Relative: 3 %
HCT: 30.5 % — ABNORMAL LOW (ref 36.0–46.0)
Hemoglobin: 9.2 g/dL — ABNORMAL LOW (ref 12.0–15.0)
Immature Granulocytes: 0 %
Lymphocytes Relative: 29 %
Lymphs Abs: 1.2 K/uL (ref 0.7–4.0)
MCH: 23.1 pg — ABNORMAL LOW (ref 26.0–34.0)
MCHC: 30.2 g/dL (ref 30.0–36.0)
MCV: 76.6 fL — ABNORMAL LOW (ref 80.0–100.0)
Monocytes Absolute: 0.4 K/uL (ref 0.1–1.0)
Monocytes Relative: 10 %
Neutro Abs: 2.2 K/uL (ref 1.7–7.7)
Neutrophils Relative %: 57 %
Platelet Count: 268 K/uL (ref 150–400)
RBC: 3.98 MIL/uL (ref 3.87–5.11)
RDW: 19.2 % — ABNORMAL HIGH (ref 11.5–15.5)
WBC Count: 3.9 K/uL — ABNORMAL LOW (ref 4.0–10.5)
nRBC: 0 % (ref 0.0–0.2)

## 2024-04-16 LAB — FERRITIN: Ferritin: 14 ng/mL (ref 11–307)

## 2024-04-16 LAB — IRON AND IRON BINDING CAPACITY (CC-WL,HP ONLY)
Iron: 13 ug/dL — ABNORMAL LOW (ref 28–170)
Saturation Ratios: 3 % — ABNORMAL LOW (ref 10.4–31.8)
TIBC: 393 ug/dL (ref 250–450)
UIBC: 380 ug/dL (ref 148–442)

## 2024-04-16 LAB — VITAMIN B12: Vitamin B-12: 229 pg/mL (ref 180–914)

## 2024-04-16 LAB — FOLATE: Folate: 21.1 ng/mL (ref >5.9)

## 2024-04-16 MED ORDER — ACCRUFER 30 MG PO CAPS: 2.0000 | ORAL_CAPSULE | Freq: Every day | ORAL | 3 refills | Status: DC

## 2024-04-16 NOTE — Assessment & Plan Note (Addendum)
 Chronic iron deficiency anemia with low hemoglobin levels, currently at 9.7 g/dL as of Feb 18, 2024, previously 9.3 g/dL in February 7974. Iron stores and ferritin are low, with ferritin at 7.1 ng/mL and iron at 14 mcg/dL.   She is taking Accrufer  (ferric maltol ) twice daily, well tolerated without constipation or heartburn.  She does admit to missing few doses and takes it when she remembers.  No symptoms of anemia such as chest pain, shortness of breath, or dizziness. Cravings for ice suggest ongoing low iron levels. No heavy menstrual cycles reported. Previous blood transfusion in 2011-2012 for severe anemia with hemoglobin at 5 g/dL.  Labs today revealed hemoglobin of 9.2, MCV 76.6.  Iron studies continue to show iron deficiency with iron saturation of 3%, iron decreased at 13, ferritin borderline low at 14.  Vitamin B12 and folic acid are normal.  I called patient with these results.  She prefers to avoid IV iron due to cost concerns.  She stated that she will be diligent in taking Accrufer  twice daily going forward.  - Continue Accrufer  (ferric maltol ) twice daily on an empty stomach, one hour before or two hours after meals.  It should be taken on an empty stomach for better absorption.  - If hemoglobin is below 9 g/dL or iron levels remain low, consider IV iron therapy after return visit, but prioritize oral therapy due to cost concerns.  RTC in 3 months for reevaluation.

## 2024-04-16 NOTE — Progress Notes (Signed)
 [facility]  HEMATOLOGY CLINIC CONSULTATION NOTE   PATIENT NAME: Colleen Fisher   MR#: [id number] DOB: Jul 29, 1981  DATE OF SERVICE: 04/16/2024  Patient Care Team: Colleen Lauraine BRAVO, NP as PCP - General (Nurse Practitioner) Colleen Ted Morrison, DO as Consulting Physician (Obstetrics and Gynecology)  REASON FOR CONSULTATION/ CHIEF COMPLAINT:  Evaluation of anemia.  ASSESSMENT & PLAN:   Colleen Fisher is a 43 y.o. lady with a past medical history of herpes simplex, chronic anemia, was referred to our service for evaluation of iron deficiency anemia.    Iron deficiency anemia Chronic iron deficiency anemia with low hemoglobin levels, currently at 9.7 g/dL as of Feb 18, 2024, previously 9.3 g/dL in February 7974. Iron stores and ferritin are low, with ferritin at 7.1 ng/mL and iron at 14 mcg/dL.   She is taking Accrufer  (ferric maltol ) twice daily, well tolerated without constipation or heartburn.  She does admit to missing few doses and takes it when she remembers.  No symptoms of anemia such as chest pain, shortness of breath, or dizziness. Cravings for ice suggest ongoing low iron levels. No heavy menstrual cycles reported. Previous blood transfusion in 2011-2012 for severe anemia with hemoglobin at 5 g/dL.  Labs today revealed hemoglobin of 9.2, MCV 76.6.  Iron studies continue to show iron deficiency with iron saturation of 3%, iron decreased at 13, ferritin borderline low at 14.  Vitamin B12 and folic acid are normal.  I called patient with these results.  She prefers to avoid IV iron due to cost concerns.  She stated that she will be diligent in taking Accrufer  twice daily going forward.  - Continue Accrufer  (ferric maltol ) twice daily on an empty stomach, one hour before or two hours after meals.  It should be taken on an empty stomach for better absorption.  - If hemoglobin is below 9 g/dL or iron levels remain low, consider IV iron therapy after return visit, but  prioritize oral therapy due to cost concerns.  RTC in 3 months for reevaluation.   Since the cause of anemia seems to be obvious from iron deficiency, I am not pursuing extensive workup at this time.  If inadequate response to iron replacement is noted, we will pursue workup to rule out other etiologies.  I reviewed lab results and outside records for this visit and discussed relevant results with the patient. Diagnosis, plan of care and treatment options were also discussed in detail with the patient. Opportunity provided to ask questions and answers provided to her apparent satisfaction. Provided instructions to call our clinic with any problems, questions or concerns prior to return visit. I recommended to continue follow-up with PCP and sub-specialists. She verbalized understanding and agreed with the plan. No barriers to learning was detected.  Colleen Headen, MD [facility] - A DEPT OF [facility] [address] [place] [address] Dept: [phone] Dept Fax: [phone]  04/16/2024 2:07 PM  HISTORY OF PRESENT ILLNESS:  Discussed the use of AI scribe software for clinical note transcription with the patient, who gave verbal consent to proceed.  History of Present Illness Colleen Fisher is a 43 year old female with anemia who presents for evaluation of her iron levels and anemia management. She was referred by her OBGYN for evaluation of persistent anemia and consideration of IV iron therapy.  Labs at her OB/GYN's office on 02/18/2024 showed hemoglobin of 9.7, hematocrit 32.7, MCV 78.  White count 4600.  Platelet count normal at 221,000.  Iron labs showed decreased iron saturation of 4%, ferritin decreased at 10, iron decreased at 17.  She has been on oral iron supplements, with Accrufer  lately.  Given persistent iron deficiency anemia, referral was sent to us  for further evaluation and consideration of IV iron.  She  has been experiencing anemia with a hemoglobin level of 9.7 in May, which was an increase from 9.3 in February. She has been on iron supplements, initially over-the-counter, but recently switched to a prescribed formulation, Accrufer , which she takes twice daily. No side effects with the new medication.  In the past, she has experienced nausea with over-the-counter iron supplements. She has a history of a blood transfusion in 2011 or 2012 due to severe anemia with a hemoglobin level of 5.0, which was associated with shortness of breath. Following the transfusion, she received IV iron treatments.  No current symptoms of chest pain, shortness of breath, or dizziness. However, she has a persistent craving for ice, which she associates with low iron levels. She also feels tired, but attributes this to lack of sleep rather than anemia.  She has not experienced any significant bleeding, such as blood in stools, black stools, epistaxis, or gum bleeds. She notes occasional minor bleeding from anal fissures due to constipation. Her menstrual cycles are not heavy, and she has not had a colonoscopy yet.  Her current medication, Accrufer , is a ferric maltose capsule. She does not take Accrufer  with vitamin C.      MEDICAL HISTORY:  Past Medical History:  Diagnosis Date   Anemia    Blood transfusion without reported diagnosis    Hx of chlamydia infection    SVD (spontaneous vaginal delivery) 10/15/2018   Uterine atony, postpartum, current hospitalization 10/15/2018   Vaginal Pap smear, abnormal     SURGICAL HISTORY: Past Surgical History:  Procedure Laterality Date   ADENOIDECTOMY     COLPOSCOPY     ENDOMETRIAL BIOPSY      SOCIAL HISTORY: She reports that she has never smoked. She has never used smokeless tobacco. She reports that she does not drink alcohol and does not use drugs. Social History   Socioeconomic History   Marital status: Single    Spouse name: Not on file   Number of children:  Not on file   Years of education: Not on file   Highest education level: Not on file  Occupational History   Not on file  Tobacco Use   Smoking status: Never   Smokeless tobacco: Never  Vaping Use   Vaping status: Never Used  Substance and Sexual Activity   Alcohol use: Never   Drug use: Never   Sexual activity: Yes    Birth control/protection: None  Other Topics Concern   Not on file  Social History Narrative   Not on file   Social Drivers of Health   Financial Resource Strain: Low Risk  (10/15/2018)   Overall Financial Resource Strain (CARDIA)    Difficulty of Paying Living Expenses: Not hard at all  Food Insecurity: No Food Insecurity (04/16/2024)   Hunger Vital Sign    Worried About Running Out of Food in the Last Year: Never true    Ran Out of Food in the Last Year: Never true  Transportation Needs: No Transportation Needs (04/16/2024)   PRAPARE - Administrator, Civil Service (Medical): No    Lack of Transportation (Non-Medical): No  Physical Activity: Inactive (10/15/2018)  Exercise Vital Sign    Days of Exercise per Week: 0 days    Minutes of Exercise per Session: 0 min  Stress: No Stress Concern Present (10/15/2018)   [employer] of Occupational Health - Occupational Stress Questionnaire    Feeling of Stress : Not at all  Social Connections: Unknown (10/15/2018)   Social Connection and Isolation Panel    Frequency of Communication with Friends and Family: More than three times a week    Frequency of Social Gatherings with Friends and Family: More than three times a week    Attends Religious Services: Not on file    Active Member of Clubs or Organizations: Not on file    Attends Banker Meetings: Not on file    Marital Status: Not on file  Intimate Partner Violence: Not At Risk (04/16/2024)   Humiliation, Afraid, Rape, and Kick questionnaire    Fear of Current or Ex-Partner: No    Emotionally Abused: No    Physically Abused: No     Sexually Abused: No    FAMILY HISTORY: No family history on file.  ALLERGIES:  She has no known allergies.  MEDICATIONS:  Current Outpatient Medications  Medication Sig Dispense Refill   ibuprofen  (ADVIL ,MOTRIN ) 600 MG tablet Take 1 tablet (600 mg total) by mouth every 6 (six) hours as needed. 40 tablet 1   valACYclovir (VALTREX) 500 MG tablet Take 500 mg by mouth 2 (two) times daily.     ACCRUFER  30 MG CAPS Take 2 capsules (60 mg total) by mouth daily. 60 capsule 3   No current facility-administered medications for this visit.    REVIEW OF SYSTEMS:    Review of Systems - Oncology  All other pertinent systems were reviewed and were negative except as mentioned above.  PHYSICAL EXAMINATION:    Onc Performance Status - 04/16/24 1004       ECOG Perf Status   ECOG Perf Status Fully active, able to carry on all pre-disease performance without restriction      KPS SCALE   KPS % SCORE Able to carry on normal activity, minor s/s of disease          Vitals:   04/16/24 0945  BP: 102/63  Pulse: 71  Resp: 16  Temp: 97.7 F (36.5 C)  SpO2: 100%   Filed Weights   04/16/24 0945  Weight: 152 lb 3.2 oz (69 kg)    Physical Exam Constitutional:      General: She is not in acute distress.    Appearance: Normal appearance.  HENT:     Head: Normocephalic and atraumatic.  Cardiovascular:     Rate and Rhythm: Normal rate.  Pulmonary:     Effort: Pulmonary effort is normal. No respiratory distress.  Abdominal:     General: There is no distension.  Neurological:     General: No focal deficit present.     Mental Status: She is alert and oriented to person, place, and time.  Psychiatric:        Mood and Affect: Mood normal.        Behavior: Behavior normal.      LABORATORY DATA:   I have reviewed the data as listed.  Results for orders placed or performed in visit on 04/16/24  CMP (Cancer Center only)  Result Value Ref Range   Sodium 138 135 - 145 mmol/L    Potassium 3.6 3.5 - 5.1 mmol/L   Chloride 107 98 - 111 mmol/L   CO2 27  22 - 32 mmol/L   Glucose, Bld 91 70 - 99 mg/dL   BUN 13 6 - 20 mg/dL   Creatinine 9.16 9.55 - 1.00 mg/dL   Calcium 9.1 8.9 - 89.6 mg/dL   Total Protein 8.2 (H) 6.5 - 8.1 g/dL   Albumin 4.3 3.5 - 5.0 g/dL   AST 24 15 - 41 U/L   ALT 22 0 - 44 U/L   Alkaline Phosphatase 43 38 - 126 U/L   Total Bilirubin 0.4 0.0 - 1.2 mg/dL   GFR, Estimated >39 >39 mL/min   Anion gap 4 (L) 5 - 15  Folate  Result Value Ref Range   Folate 21.1 >5.9 ng/mL  Vitamin B12  Result Value Ref Range   Vitamin B-12 229 180 - 914 pg/mL  Ferritin  Result Value Ref Range   Ferritin 14 11 - 307 ng/mL  Iron and Iron Binding Capacity ([facility] only)  Result Value Ref Range   Iron 13 (L) 28 - 170 ug/dL   TIBC 606 749 - 549 ug/dL   Saturation Ratios 3 (L) 10.4 - 31.8 %   UIBC 380 148 - 442 ug/dL  CBC with Differential (Cancer Center Only)  Result Value Ref Range   WBC Count 3.9 (L) 4.0 - 10.5 K/uL   RBC 3.98 3.87 - 5.11 MIL/uL   Hemoglobin 9.2 (L) 12.0 - 15.0 g/dL   HCT 69.4 (L) 63.9 - 53.9 %   MCV 76.6 (L) 80.0 - 100.0 fL   MCH 23.1 (L) 26.0 - 34.0 pg   MCHC 30.2 30.0 - 36.0 g/dL   RDW 80.7 (H) 88.4 - 84.4 %   Platelet Count 268 150 - 400 K/uL   nRBC 0.0 0.0 - 0.2 %   Neutrophils Relative % 57 %   Neutro Abs 2.2 1.7 - 7.7 K/uL   Lymphocytes Relative 29 %   Lymphs Abs 1.2 0.7 - 4.0 K/uL   Monocytes Relative 10 %   Monocytes Absolute 0.4 0.1 - 1.0 K/uL   Eosinophils Relative 3 %   Eosinophils Absolute 0.1 0.0 - 0.5 K/uL   Basophils Relative 1 %   Basophils Absolute 0.1 0.0 - 0.1 K/uL   Immature Granulocytes 0 %   Abs Immature Granulocytes 0.01 0.00 - 0.07 K/uL    RADIOGRAPHIC STUDIES:  No recent pertinent imaging studies available to review.  Orders Placed This Encounter  Procedures   CBC with Differential (Cancer Center Only)    Standing Status:   Future    Number of Occurrences:   1    Expiration Date:   04/16/2025   Iron  and Iron Binding Capacity ([facility] only)    Standing Status:   Future    Number of Occurrences:   1    Expiration Date:   04/16/2025   Ferritin    Standing Status:   Future    Number of Occurrences:   1    Expiration Date:   04/16/2025   Vitamin B12    Standing Status:   Future    Number of Occurrences:   1    Expiration Date:   04/16/2025   Folate    Standing Status:   Future    Number of Occurrences:   1    Expiration Date:   04/16/2025   CMP (Cancer Center only)    Standing Status:   Future    Number of Occurrences:   1    Expiration Date:   04/16/2025   CBC with Differential (  Cancer Center Only)    Standing Status:   Future    Expected Date:   07/17/2024    Expiration Date:   10/15/2024   Iron and Iron Binding Capacity ([facility] only)    Standing Status:   Future    Expected Date:   07/17/2024    Expiration Date:   10/15/2024   Ferritin    Standing Status:   Future    Expected Date:   07/17/2024    Expiration Date:   10/15/2024    Future Appointments  Date Time Provider Department Center  07/17/2024  3:00 PM [facility] None  07/17/2024  3:30 PM Kenneth Cuaresma, MD [facility] None     I spent a total of 40 minutes during this encounter with the patient including review of chart and various tests results, discussions about plan of care and coordination of care plan.  This document was completed utilizing speech recognition software. Grammatical errors, random word insertions, pronoun errors, and incomplete sentences are an occasional consequence of this system due to software limitations, ambient noise, and hardware issues. Any formal questions or concerns about the content, text or information contained within the body of this dictation should be directly addressed to the provider for clarification.

## 2024-04-16 NOTE — Progress Notes (Signed)
 I get notices but I always pay it.

## 2024-05-21 ENCOUNTER — Ambulatory Visit: Admitting: Family Medicine

## 2024-06-05 ENCOUNTER — Ambulatory Visit: Admitting: Nurse Practitioner

## 2024-06-10 ENCOUNTER — Ambulatory Visit: Payer: Self-pay

## 2024-06-10 NOTE — Telephone Encounter (Signed)
 FYI Only or Action Required?: FYI only for provider.  Patient was last seen in primary care on 12/04/2023 by Elnor Lauraine BRAVO, NP.  Called Nurse Triage reporting Eye Problem.  Symptoms began a week ago.  Interventions attempted: Nothing.  Symptoms are: unchanged.  Triage Disposition: See PCP Within 2 Weeks  Patient/caregiver understands and will follow disposition?: Yes    Copied from CRM #[id number]. Topic: Clinical - Red Word Triage >> Jun 10, 2024 12:09 PM [place] E wrote: [facility] that prompted transfer to Nurse Triage: Vision changes, seeing yellow spots for a couple weeks. Throat pain, and then discomfort behind right knee. Reason for Disposition  [1] Blurred vision or visual changes AND [2] gradual onset (e.g., weeks, months)  Answer Assessment - Initial Assessment Questions 1. DESCRIPTION: How has your vision changed? (e.g., complete vision loss, blurred vision, double vision, floaters, etc.)     Vision changes seeing yellow spots for a couple of weeks, throat pain, and discomfort. 2. LOCATION: One or both eyes? If one, ask: Which eye?     both 3. SEVERITY: Can you see anything? If Yes, ask: What can you see? (e.g., fine print)     yes 4. ONSET: When did this begin? Did it start suddenly or has this been gradual?     Couple weeks ago 5. PATTERN: Does this come and go, or has it been constant since it started?     Comes and goes 6. PAIN: Is there any pain in your eye(s)?  (Scale 1-10; or mild, moderate, severe)    denies 7. CONTACTS-GLASSES: Do you wear contacts or glasses?     Yes, glasses 8. CAUSE: What do you think is causing this visual problem?     no 9. OTHER SYMPTOMS: Do you have any other symptoms? (e.g., confusion, headache, arm or leg weakness, speech problems)     Sore throat, discomfort behind knee 10. PREGNANCY: Is there any chance you are pregnant? When was your last menstrual period?       na  Protocols used: Vision Loss or  Change-A-AH

## 2024-06-10 NOTE — Telephone Encounter (Signed)
    Copied from CRM #[id number]. Topic: Clinical - Red Word Triage >> Jun 10, 2024 12:09 PM [place] E wrote: [facility] that prompted transfer to Nurse Triage: Vision changes, seeing yellow spots for a couple weeks. Throat pain, and then discomfort behind right knee.

## 2024-06-11 NOTE — Telephone Encounter (Signed)
 No triage performed, phone falilure  This encounter was created in error - please disregard.

## 2024-06-12 ENCOUNTER — Ambulatory Visit: Admitting: Nurse Practitioner

## 2024-06-12 ENCOUNTER — Encounter: Payer: Self-pay | Admitting: Nurse Practitioner

## 2024-06-12 ENCOUNTER — Ambulatory Visit

## 2024-06-12 ENCOUNTER — Ambulatory Visit: Payer: Self-pay | Admitting: Nurse Practitioner

## 2024-06-12 VITALS — BP 112/78 | HR 74 | Temp 98.6°F | Ht 64.0 in | Wt 153.4 lb

## 2024-06-12 DIAGNOSIS — R2241 Localized swelling, mass and lump, right lower limb: Secondary | ICD-10-CM

## 2024-06-12 DIAGNOSIS — B9689 Other specified bacterial agents as the cause of diseases classified elsewhere: Secondary | ICD-10-CM

## 2024-06-12 DIAGNOSIS — J029 Acute pharyngitis, unspecified: Secondary | ICD-10-CM

## 2024-06-12 DIAGNOSIS — M25522 Pain in left elbow: Secondary | ICD-10-CM

## 2024-06-12 DIAGNOSIS — K130 Diseases of lips: Secondary | ICD-10-CM

## 2024-06-12 DIAGNOSIS — H539 Unspecified visual disturbance: Secondary | ICD-10-CM

## 2024-06-12 DIAGNOSIS — D509 Iron deficiency anemia, unspecified: Secondary | ICD-10-CM

## 2024-06-12 DIAGNOSIS — N898 Other specified noninflammatory disorders of vagina: Secondary | ICD-10-CM

## 2024-06-12 LAB — COMPREHENSIVE METABOLIC PANEL WITH GFR
ALT: 14 U/L (ref 0–35)
AST: 17 U/L (ref 0–37)
Albumin: 4.3 g/dL (ref 3.5–5.2)
Alkaline Phosphatase: 42 U/L (ref 39–117)
BUN: 12 mg/dL (ref 6–23)
CO2: 26 meq/L (ref 19–32)
Calcium: 9 mg/dL (ref 8.4–10.5)
Chloride: 103 meq/L (ref 96–112)
Creatinine, Ser: 0.78 mg/dL (ref 0.40–1.20)
GFR: 92.69 mL/min (ref >60.00)
Glucose, Bld: 83 mg/dL (ref 70–99)
Potassium: 3.8 meq/L (ref 3.5–5.1)
Sodium: 138 meq/L (ref 135–145)
Total Bilirubin: 0.4 mg/dL (ref 0.2–1.2)
Total Protein: 8 g/dL (ref 6.0–8.3)

## 2024-06-12 LAB — CBC
HCT: 33.9 % — ABNORMAL LOW (ref 36.0–46.0)
Hemoglobin: 10.7 g/dL — ABNORMAL LOW (ref 12.0–15.0)
MCHC: 31.5 g/dL (ref 30.0–36.0)
MCV: 81.6 fl (ref 78.0–100.0)
Platelets: 231 K/uL (ref 150.0–400.0)
RBC: 4.15 Mil/uL (ref 3.87–5.11)
RDW: 21.2 % — ABNORMAL HIGH (ref 11.5–15.5)
WBC: 6.1 K/uL (ref 4.0–10.5)

## 2024-06-12 LAB — IRON: Iron: 15 ug/dL — ABNORMAL LOW (ref 42–145)

## 2024-06-12 LAB — VITAMIN B12: Vitamin B-12: 313 pg/mL (ref 211–911)

## 2024-06-12 LAB — FERRITIN: Ferritin: 11.9 ng/mL (ref 10.0–291.0)

## 2024-06-12 NOTE — Assessment & Plan Note (Signed)
 Cyclic vaginal discharge and odor, possible bacterial vaginosis Cyclic vaginal discharge and odor, particularly around menstrual cycle. Differential includes bacterial vaginosis. - Send swab for bacterial vaginosis, yeast, gonorrhea, and chlamydia testing.

## 2024-06-12 NOTE — Assessment & Plan Note (Signed)
 Unspecified visual disturbance with yellow spots Intermittent yellow spots without headaches or pain. Blood pressure normal. Differential includes retinal detachment or other ocular issues. - Refer to ophthalmologist for further evaluation.

## 2024-06-12 NOTE — Assessment & Plan Note (Signed)
 Oral mucosal lesion, unspecified Small, clear, non-painful lesion on inside of lip. Differential includes mucosal ulcer or vitamin deficiency. - Check vitamin B12 levels. -Refer to orofacial surgeon

## 2024-06-12 NOTE — Assessment & Plan Note (Signed)
 Left elbow discomfort and anatomical change Anatomical change in left elbow post-trauma with discomfort when typing. No pain, strength and sensation intact. - Order x-ray of the left elbow. - Confirmed no pregnancy risk before x-ray.

## 2024-06-12 NOTE — Assessment & Plan Note (Signed)
 Right popliteal mass with swelling and discomfort Mass behind right knee with swelling and discomfort. Differential includes cyst or lipoma. - Order ultrasound of the right popliteal area. - Discussed insurance coverage and potential costs.

## 2024-06-12 NOTE — Assessment & Plan Note (Addendum)
 Iron deficiency anemia Currently taking over-the-counter iron supplement, Blood Builder, with no significant side effects. Accrufer  was too expensive and caused gastrointestinal side effects (excessive gas). - Check iron levels. - Continue current iron supplement if tolerated.

## 2024-06-12 NOTE — Assessment & Plan Note (Addendum)
 Recurrent right tonsillar discomfort and pharyngitis Recurrent sore throat with small white spot on right tonsil. Differential includes tonsil stones or strep throat. - Perform strep test. (Negative today) - Perform COVID-19 test. (Negative today) -Supportive treatment. Continue ibuprofen  as needed, follow-up with ENT if symptoms persist or worsen

## 2024-06-12 NOTE — Addendum Note (Signed)
 Addended by: ELNOR LAURAINE BRAVO on: 06/12/2024 06:23 PM   Modules accepted: Level of Service

## 2024-06-12 NOTE — Progress Notes (Addendum)
 Established Patient Office Visit  Subjective   Patient ID: Colleen Fisher, female    DOB: 08/08/1981  Age: 43 y.o. MRN: [id number]  Chief Complaint  Patient presents with   knee mass    Discussed the use of AI scribe software for clinical note transcription with the patient, who gave verbal consent to proceed.  History of Present Illness Colleen Fisher is a 43 year old female who presents with concerns about her right leg, left elbow, throat pain, mass of lower lip, and iron deficiency.  Right lower extremity discomfort - Discomfort localized to the right leg behind the knee - Symptoms are noticeable when standing and walking   Left elbow deformity and discomfort - Change in anatomy of the left elbow following an injury a few months ago - Presence of a divot and slight curve at the elbow - Discomfort when typing - Sensation of bone shifting - Uses a cushion to alleviate pressure while typing - No numbness or difficulty gripping objects  Oropharyngeal pain - Throat pain onset around Sunday or Monday - Intermittent white spots on the right tonsil - Discomfort with swallowing - No fever, chills, or sick contacts - Ibuprofen  previously prescribed for similar symptoms. Ibuprofen  will help but only temporarily -Went To ENT in the past for enlarged tonsils. Treatment recommendation was watchful waiting and to avoid tonsillectomy at this time due to lack of frequent infections  Mass of lower lip -Present for a few days to week -No recent trauma -Nontender, only bothersome because she can feel it is there -No drainage  Iron deficiency - Iron deficiency present - Currently taking Blood Builder supplement containing vitamin C and 26 mg of iron - Supplement use for approximately three weeks without issues  Vaginal discharge -cyclical with menstrual cycle -concern for BV, is sexually active -LMP less than 2 weeks ago      ROS: see HPI    Objective:     BP 112/78    Pulse 74   Temp 98.6 F (37 C) (Temporal)   Ht 5' 4 (1.626 m)   Wt 153 lb 6 oz (69.6 kg)   SpO2 97%   BMI 26.33 kg/m    Physical Exam Vitals reviewed.  Constitutional:      General: She is not in acute distress.    Appearance: Normal appearance.  HENT:     Head: Normocephalic and atraumatic.     Mouth/Throat:     Pharynx: Pharyngeal swelling and posterior oropharyngeal erythema present.   Neck:     Vascular: No carotid bruit.  Cardiovascular:     Rate and Rhythm: Normal rate and regular rhythm.     Pulses: Normal pulses.     Heart sounds: Normal heart sounds.  Pulmonary:     Effort: Pulmonary effort is normal.     Breath sounds: Normal breath sounds.  Musculoskeletal:       Arms:       Legs:  Skin:    General: Skin is warm and dry.  Neurological:     General: No focal deficit present.     Mental Status: She is alert and oriented to person, place, and time.  Psychiatric:        Mood and Affect: Mood normal.        Behavior: Behavior normal.        Judgment: Judgment normal.      No results found for any visits on 06/12/24.    The ASCVD Risk score (Arnett DK, et  al., 2019) failed to calculate for the following reasons:   The valid total cholesterol range is 130 to 320 mg/dL    Assessment & Plan:   Problem List Items Addressed This Visit       Respiratory   Pharyngitis   Recurrent right tonsillar discomfort and pharyngitis Recurrent sore throat with small white spot on right tonsil. Differential includes tonsil stones or strep throat. - Perform strep test. (Negative today) - Perform COVID-19 test. (Negative today) -Supportive treatment. Continue ibuprofen  as needed, follow-up with ENT if symptoms persist or worsen      Relevant Orders   NuSwab Vaginitis Plus (VG+)   HIV Antibody (routine testing w rflx)   RPR   Vitamin B12   hCG, serum, qualitative   Comprehensive metabolic panel with GFR   TSH     Other   Iron deficiency anemia   Iron  deficiency anemia Currently taking over-the-counter iron supplement, Blood Builder, with no significant side effects. Accrufer  was too expensive and caused gastrointestinal side effects (excessive gas). - Check iron levels. - Continue current iron supplement if tolerated.      Relevant Medications   ferrous sulfate 324 MG TBEC   Other Relevant Orders   Iron   Ferritin   CBC   NuSwab Vaginitis Plus (VG+)   HIV Antibody (routine testing w rflx)   RPR   Vitamin B12   hCG, serum, qualitative   Comprehensive metabolic panel with GFR   TSH   Left elbow pain - Primary   Left elbow discomfort and anatomical change Anatomical change in left elbow post-trauma with discomfort when typing. No pain, strength and sensation intact. - Order x-ray of the left elbow. - Confirmed no pregnancy risk before x-ray.      Relevant Orders   DG Elbow 2 Views Left (Completed)   NuSwab Vaginitis Plus (VG+)   HIV Antibody (routine testing w rflx)   RPR   Vitamin B12   hCG, serum, qualitative   Comprehensive metabolic panel with GFR   TSH   Knee mass, right   Right popliteal mass with swelling and discomfort Mass behind right knee with swelling and discomfort. Differential includes cyst or lipoma. - Order ultrasound of the right popliteal area. - Discussed insurance coverage and potential costs.      Relevant Orders   US  SOFT TISSUE LOWER EXTREMITY LIMITED RIGHT (NON-VASCULAR)   NuSwab Vaginitis Plus (VG+)   HIV Antibody (routine testing w rflx)   RPR   Vitamin B12   hCG, serum, qualitative   Comprehensive metabolic panel with GFR   TSH   Vaginal discharge   Cyclic vaginal discharge and odor, possible bacterial vaginosis Cyclic vaginal discharge and odor, particularly around menstrual cycle. Differential includes bacterial vaginosis. - Send swab for bacterial vaginosis, yeast, gonorrhea, and chlamydia testing.      Relevant Orders   NuSwab Vaginitis Plus (VG+)   HIV Antibody (routine  testing w rflx)   RPR   hCG, serum, qualitative   Visual changes   Unspecified visual disturbance with yellow spots Intermittent yellow spots without headaches or pain. Blood pressure normal. Differential includes retinal detachment or other ocular issues. - Refer to ophthalmologist for further evaluation.      Relevant Orders   NuSwab Vaginitis Plus (VG+)   HIV Antibody (routine testing w rflx)   RPR   Vitamin B12   hCG, serum, qualitative   Ambulatory referral to Ophthalmology   Comprehensive metabolic panel with GFR   TSH  Lip mass   Oral mucosal lesion, unspecified Small, clear, non-painful lesion on inside of lip. Differential includes mucosal ulcer or vitamin deficiency. - Check vitamin B12 levels. -Refer to orofacial surgeon      Relevant Orders   Ambulatory referral to Oral Maxillofacial Surgery   Assessment and Plan Assessment & Plan Right popliteal mass with swelling and discomfort Mass behind right knee with swelling and discomfort. Differential includes cyst or lipoma. - Order ultrasound of the right popliteal area. - Discussed insurance coverage and potential costs.  Left elbow discomfort and anatomical change Anatomical change in left elbow post-trauma with discomfort when typing. No pain, strength and sensation intact. - Order x-ray of the left elbow. - Confirmed no pregnancy risk before x-ray.  Iron deficiency anemia Currently taking over-the-counter iron supplement, Blood Builder, with no significant side effects. Accrufer  was too expensive and caused gastrointestinal side effects (excessive gas). - Check iron levels. - Continue current iron supplement if tolerated.  Recurrent right tonsillar discomfort and pharyngitis Recurrent sore throat with small white spot on right tonsil. Differential includes tonsil stones or strep throat. - Perform strep test. (Negative today) - Perform COVID-19 test. (Negative today) -Supportive treatment. Continue  ibuprofen  as needed, follow-up with ENT if symptoms persist or worsen  Oral mucosal lesion, unspecified Small, clear, non-painful lesion on inside of lip. Differential includes mucosal ulcer or vitamin deficiency. - Check vitamin B12 levels. -Refer to orofacial surgeon  Cyclic vaginal discharge and odor, possible bacterial vaginosis Cyclic vaginal discharge and odor, particularly around menstrual cycle. Differential includes bacterial vaginosis. - Send swab for bacterial vaginosis, yeast, gonorrhea, and chlamydia testing.  Unspecified visual disturbance with yellow spots Intermittent yellow spots without headaches or pain. Blood pressure normal. Differential includes retinal detachment or other ocular issues. - Refer to ophthalmologist for further evaluation.   Return in about 6 months (around 12/10/2024) for CPE with Lauraine.   I personally spent a total of 40 minutes in the care of the patient today including preparing to see the patient, getting/reviewing separately obtained history, performing a medically appropriate exam/evaluation, counseling and educating, placing orders, referring and communicating with other health care professionals, and documenting clinical information in the EHR.   Lauraine FORBES Pereyra, NP

## 2024-06-12 NOTE — Patient Instructions (Signed)
 Monitor yourself for new fatigue, chills, night sweats, joint pain, rash. If these occur we can order initial testing for autoimmune disease.  Monitor blood pressure at home, if seeing higher than 130/80 consistently please call office.

## 2024-06-13 LAB — HIV ANTIBODY (ROUTINE TESTING W REFLEX): HIV 1&2 Ab, 4th Generation: NONREACTIVE

## 2024-06-13 LAB — RPR: RPR Ser Ql: NONREACTIVE

## 2024-06-13 LAB — HCG, SERUM, QUALITATIVE: Preg, Serum: NEGATIVE

## 2024-06-14 LAB — TSH: TSH: 1.42 u[IU]/mL (ref 0.35–5.50)

## 2024-06-16 LAB — NUSWAB VAGINITIS PLUS (VG+)
Atopobium vaginae: HIGH {score} — AB
BVAB 2: HIGH {score} — AB
Candida albicans, NAA: NEGATIVE
Candida glabrata, NAA: NEGATIVE
Chlamydia trachomatis, NAA: NEGATIVE
Megasphaera 1: HIGH {score} — AB
Neisseria gonorrhoeae, NAA: NEGATIVE
Trich vag by NAA: NEGATIVE

## 2024-06-17 ENCOUNTER — Ambulatory Visit
Admission: RE | Admit: 2024-06-17 | Discharge: 2024-06-17 | Disposition: A | Discharge status: Home / Self Care | Source: Ambulatory Visit | Attending: Nurse Practitioner | Admitting: Nurse Practitioner

## 2024-06-17 DIAGNOSIS — R2241 Localized swelling, mass and lump, right lower limb: Secondary | ICD-10-CM

## 2024-06-17 MED ORDER — METRONIDAZOLE 500 MG PO TABS: 500.0000 mg | ORAL_TABLET | Freq: Two times a day (BID) | ORAL | 0 refills | Status: DC

## 2024-06-18 ENCOUNTER — Other Ambulatory Visit: Payer: Self-pay | Admitting: Nurse Practitioner

## 2024-06-18 ENCOUNTER — Telehealth: Payer: Self-pay | Admitting: Radiology

## 2024-06-18 DIAGNOSIS — J029 Acute pharyngitis, unspecified: Secondary | ICD-10-CM

## 2024-06-18 MED ORDER — IBUPROFEN 600 MG PO TABS: 600.0000 mg | ORAL_TABLET | Freq: Three times a day (TID) | ORAL | 1 refills | Status: AC | PRN

## 2024-06-18 NOTE — Telephone Encounter (Signed)
 Copied from CRM [id number]. Topic: Clinical - Medication Question >> Jun 18, 2024  4:15 PM Colleen Fisher wrote: Reason for CRM: Patient called in regarding medication , wanted someone to call her back today regarding this as she has been asking for refills for the past 3 days and now [facility] has contacted and stated the medication was denied   valACYclovir  (VALTREX ) 500 MG tablet  ibuprofen  (ADVIL ,MOTRIN ) 600 MG tablet

## 2024-06-18 NOTE — Telephone Encounter (Signed)
 Copied from CRM #[id number]. Topic: Clinical - Medication Refill >> Jun 18, 2024 10:17 AM Montie POUR wrote: Medication:  valACYclovir  (VALTREX ) 500 MG tablet ibuprofen  (ADVIL ,MOTRIN ) 600 MG tablet  Has the patient contacted their pharmacy? Yes (Agent: If no, request that the patient contact the pharmacy for the refill. If patient does not wish to contact the pharmacy document the reason why and proceed with request.) (Agent: If yes, when and what did the pharmacy advise?) Pharmacy needs order to refill  This is the patient's preferred pharmacy:  [id number] - [address] [place] [address] Phone: [phone] Fax: [phone]  Is this the correct pharmacy for this prescription? Yes If no, delete pharmacy and type the correct one.   Has the prescription been filled recently? No  Is the patient out of the medication? Yes - She has been out of medication for 2 days.   Has the patient been seen for an appointment in the last year OR does the patient have an upcoming appointment? Yes  Can we respond through MyChart? Yes  Agent: Please be advised that Rx refills may take up to 3 business days. We ask that you follow-up with your pharmacy.

## 2024-06-18 NOTE — Telephone Encounter (Signed)
 Estimated Creatinine Clearance: 86.9 mL/min (by C-G formula based on SCr of 0.78 mg/dL).

## 2024-06-19 ENCOUNTER — Other Ambulatory Visit: Payer: Self-pay | Admitting: Nurse Practitioner

## 2024-06-19 DIAGNOSIS — A6004 Herpesviral vulvovaginitis: Secondary | ICD-10-CM

## 2024-06-19 MED ORDER — VALACYCLOVIR HCL 500 MG PO TABS: 500.0000 mg | ORAL_TABLET | Freq: Two times a day (BID) | ORAL | 6 refills | Status: AC

## 2024-06-19 NOTE — Telephone Encounter (Signed)
 Medication has been send in and also made pt aware via mychart.

## 2024-06-23 ENCOUNTER — Encounter (INDEPENDENT_AMBULATORY_CARE_PROVIDER_SITE_OTHER): Payer: Self-pay | Admitting: Otolaryngology

## 2024-06-23 ENCOUNTER — Ambulatory Visit (INDEPENDENT_AMBULATORY_CARE_PROVIDER_SITE_OTHER): Admitting: Otolaryngology

## 2024-06-23 ENCOUNTER — Encounter: Payer: Self-pay | Admitting: Nurse Practitioner

## 2024-06-23 VITALS — BP 113/75 | HR 85 | Ht 64.0 in | Wt 151.0 lb

## 2024-06-23 DIAGNOSIS — J028 Acute pharyngitis due to other specified organisms: Secondary | ICD-10-CM | POA: Diagnosis not present

## 2024-06-23 DIAGNOSIS — J351 Hypertrophy of tonsils: Secondary | ICD-10-CM

## 2024-06-23 DIAGNOSIS — R490 Dysphonia: Secondary | ICD-10-CM

## 2024-06-23 DIAGNOSIS — J0391 Acute recurrent tonsillitis, unspecified: Secondary | ICD-10-CM

## 2024-06-23 MED ORDER — FLUTICASONE PROPIONATE 50 MCG/ACT NA SUSP: 2.0000 | Freq: Every day | NASAL | 6 refills | Status: DC

## 2024-06-23 MED ORDER — PREDNISONE 10 MG PO TABS: ORAL_TABLET | ORAL | 0 refills | Status: AC

## 2024-06-23 NOTE — Progress Notes (Signed)
 This encounter was created in error - please disregard.

## 2024-06-23 NOTE — Progress Notes (Signed)
 Dear Dr. Elnor, Here is my assessment for our mutual patient, Colleen Fisher. Thank you for allowing me the opportunity to care for your patient. Please do not hesitate to contact me should you have any other questions. Sincerely, Dr. Eldora Fisher  Otolaryngology Clinic Note Referring provider: Dr. Elnor HPI:  Colleen Fisher is a 43 y.o. female kindly referred by Dr. Elnor for evaluation of tonsillar hypertrophy  Initial visit (01/2024): Patient reports: reports she has had strep throat before, but in February, she noted there were some patches on it and went to [facility]. Diagnosed with pharyngitis, prescribed antibiotics, seen by PCP who noted some asymmetry on right and referred here. She reports she gets sore throat perhaps once a year -- typically does get antibiotics for it with improvement. No frequent tonsil stones. No prior PTA. No fevers/night sweats/weight loss.   Patient otherwise denies: - dysphagia, odynophagia, PNA - changes in voice, shortness of breath, hemoptysis - ear pain, neck masses  --------------------------------------------------------- 06/23/2024 Returns for follow up. Did have another episode of pharyngitis starting Sept 1st, went to UC, strep tested negative. Another strep and mono was negative. Swabbed for H flu and was positive, given azithromycin. Associated symptoms include ear discomfort, throat burning. When she yawns, sometimes it hurts. Throat pain is slowly improving.    H&N Surgery: denies - possible adenoidectomy(?) but does not remember Personal or FHx of bleeding dz or anesthesia difficulty: no  Tobacco: no  PMHx: Anemia Independent Review of Additional Tests or Records:  Colleen Fisher (11/22/2023): Tonsil hypertrophy (particularly on right), seen in UC but no discomfort; treated with amoxicillin, no other sx; this is chronic; no sx, enlarged tonsils - ref to ENT UC notes ([facility]) reviewed and uploaded or available in chart in media tab - 11/04/2023: sore  throat, but severity (0/10?), Dx: Sore throat; Rx: Augmentin; Cefdinir for acute cough 10/10/2023 Labs CBC and CMP 11/22/2023: WBC 6.1, Hgb 9.3, Plt 302; CMP shows BUN/Cr 11/0.74, no significant EL abnormalities Rapid strep 11/04/2023: negative CBC and CMP 06/12/2024: BUN/Cr 12/0.78; CBC WBC 6.1  PMH/Meds/All/SocHx/FamHx/ROS:   Past Medical History:  Diagnosis Date   Anemia    Blood transfusion without reported diagnosis    Hx of chlamydia infection    SVD (spontaneous vaginal delivery) 10/15/2018   Uterine atony, postpartum, current hospitalization 10/15/2018   Vaginal Pap smear, abnormal      Past Surgical History:  Procedure Laterality Date   ADENOIDECTOMY     COLPOSCOPY     ENDOMETRIAL BIOPSY      History reviewed. No pertinent family history.   Social Connections: Unknown (06/12/2024)   Social Connection and Isolation Panel    Frequency of Communication with Friends and Family: Patient declined    Frequency of Social Gatherings with Friends and Family: Patient declined    Attends Religious Services: Patient declined    Database administrator or Organizations: Patient declined    Attends Engineer, structural: Not on file    Marital Status: Patient declined      Current Outpatient Medications:    amoxicillin-clavulanate (AUGMENTIN) 500-125 MG tablet, Oral, Disp: , Rfl:    azithromycin (ZITHROMAX) 250 MG tablet, Take 500 mg by mouth daily., Disp: , Rfl:    ferrous sulfate 324 MG TBEC, Take 324 mg by mouth daily with breakfast., Disp: , Rfl:    fluticasone  (FLONASE ) 50 MCG/ACT nasal spray, Place 2 sprays into both nostrils daily., Disp: 16 g, Rfl: 6   ibuprofen  (ADVIL ) 600 MG tablet, Take 1  tablet (600 mg total) by mouth every 8 (eight) hours as needed., Disp: 30 tablet, Rfl: 1   metroNIDAZOLE  (FLAGYL ) 500 MG tablet, Take 1 tablet (500 mg total) by mouth 2 (two) times daily., Disp: 14 tablet, Rfl: 0   predniSONE  (DELTASONE ) 10 MG tablet, Take 2 tablets (20 mg total) by mouth  daily with breakfast for 5 days, THEN 1 tablet (10 mg total) daily with breakfast for 2 days., Disp: 12 tablet, Rfl: 0   valACYclovir  (VALTREX ) 500 MG tablet, Take 1 tablet (500 mg total) by mouth 2 (two) times daily., Disp: 60 tablet, Rfl: 6   Physical Exam:   BP 113/75 (BP Location: Left Arm, Patient Position: Sitting, Cuff Size: Normal)   Pulse 85   Ht 5' 4 (1.626 m)   Wt 151 lb (68.5 kg)   LMP 06/03/2024 (Approximate)   SpO2 96%   BMI 25.92 kg/m   Salient findings:  CN II-XII intact Bilateral EAC clear and TM intact with well pneumatized middle ear spaces Anterior rhinoscopy: Septum relatively midline; bilateral inferior turbinates with R>L inferior turbinate hypertrophy No lesions of oral cavity/oropharynx; right tonsil 2+, left 2 (right more medialized, left bigger more anterior-posterior); no exudate, no large stones No obviously palpable neck masses/lymphadenopathy/thyromegaly No respiratory distress or stridor; voice quality class 2.5; TFL was indicated to better evaluate the proximal airway, given the patient's history and exam findings, and is detailed below.   Seprately Identifiable Procedures:  Procedure Note Pre-procedure diagnosis:  Dysphonia, Pharyngitis Post-procedure diagnosis: Same Procedure: Transnasal Fiberoptic Laryngoscopy, CPT 31575 - Mod 25 Indication: see above Complications: None apparent EBL: 0 mL  The procedure was undertaken to further evaluate the patient's complaint above, with mirror exam inadequate for appropriate examination due to gag reflex and poor patient tolerance  Procedure:  Patient was identified as correct patient. Verbal consent was obtained. The nose was sprayed with oxymetazoline and 4% lidocaine . The The flexible laryngoscope was passed through the nose to view the nasal cavity, pharynx (oropharynx, hypopharynx) and larynx.  The larynx was examined at rest and during multiple phonatory tasks. Documentation was obtained and reviewed  with patient. The scope was removed. The patient tolerated the procedure well.  Findings: The nasal cavity and nasopharynx did not reveal any masses or lesions, mucosa appeared to be without obvious lesions. The tongue base, pharyngeal walls, piriform sinuses, vallecula, epiglottis and postcricoid region are normal in appearance except for mild lingual tonsil edema and tonsillar hypertrophy. The visualized portion of the subglottis and proximal trachea is widely patent. The vocal folds are mobile bilaterally. There are no lesions on the free edge of the vocal folds nor elsewhere in the larynx worrisome for malignancy.    Electronically signed by: Colleen KATHEE Blanch, MD 06/23/2024 1:42 PM   Impression & Plans:  Colleen Fisher is a 43 y.o. female with:  1. Pharyngitis due to other organism   2. Recurrent tonsillitis   3. Tonsillar hypertrophy   4. Dysphonia    Most likely had H-flu pharyngitis with referred ear pain. TFL reassuring, dysphonia likely due to pharyngitis/laryngitis as well. Her preceding jaw discomfort on left could also be TMJ. No abscess noted today As such, we discussed options - observation v/s empiric course of steroids for symptomatic improvement. Suspect things will improve with time. She opted for flonase  and prednisone  burst. Still not having enough pharyngitis/tonsillitis episodes to warrant tonsillectomy  Keep f/u in 1 year   See below regarding exact medications prescribed this encounter including dosages and route: Meds  ordered this encounter  Medications   fluticasone  (FLONASE ) 50 MCG/ACT nasal spray    Sig: Place 2 sprays into both nostrils daily.    Dispense:  16 g    Refill:  6   predniSONE  (DELTASONE ) 10 MG tablet    Sig: Take 2 tablets (20 mg total) by mouth daily with breakfast for 5 days, THEN 1 tablet (10 mg total) daily with breakfast for 2 days.    Dispense:  12 tablet    Refill:  0      Thank you for allowing me the opportunity to care for your  patient. Please do not hesitate to contact me should you have any other questions.  Sincerely, Colleen Blanch, MD Otolaryngologist (ENT), [facility] Phone: [phone] Fax: [phone]  06/23/2024, 1:41 PM   I have personally spent 48 minutes involved in face-to-face and non-face-to-face activities for this patient on the day of the visit.  Professional time spent excludes any procedures performed but includes the following activities, in addition to those noted in the documentation: preparing to see the patient (review of outside documentation and results), performing a medically appropriate examination, counseling, documenting in the electronic health record

## 2024-06-23 NOTE — Patient Instructions (Signed)
 Prednisone  - 20mg  once daily for 5 days in morning;  then take 10mg  once daily for 2 days, then stop Do daily salt water gargles Use flonase  two sprays each nostril daily for 4 weeks

## 2024-06-25 ENCOUNTER — Other Ambulatory Visit: Payer: Self-pay | Admitting: Nurse Practitioner

## 2024-06-25 DIAGNOSIS — N760 Acute vaginitis: Secondary | ICD-10-CM

## 2024-06-25 MED ORDER — METRONIDAZOLE 500 MG PO TABS: 500.0000 mg | ORAL_TABLET | Freq: Two times a day (BID) | ORAL | 0 refills | Status: DC

## 2024-07-15 NOTE — Telephone Encounter (Signed)
 Pt has been schedule for 07/17/24

## 2024-07-17 ENCOUNTER — Inpatient Hospital Stay: Admitting: Oncology

## 2024-07-17 ENCOUNTER — Inpatient Hospital Stay: Attending: Oncology

## 2024-07-17 ENCOUNTER — Encounter: Payer: Self-pay | Admitting: Nurse Practitioner

## 2024-07-17 ENCOUNTER — Ambulatory Visit: Admitting: Nurse Practitioner

## 2024-07-17 VITALS — BP 118/78 | HR 65 | Temp 98.3°F | Ht 64.0 in | Wt 152.0 lb

## 2024-07-17 DIAGNOSIS — B370 Candidal stomatitis: Secondary | ICD-10-CM

## 2024-07-17 DIAGNOSIS — K529 Noninfective gastroenteritis and colitis, unspecified: Secondary | ICD-10-CM | POA: Insufficient documentation

## 2024-07-17 DIAGNOSIS — D649 Anemia, unspecified: Secondary | ICD-10-CM | POA: Insufficient documentation

## 2024-07-17 DIAGNOSIS — E049 Nontoxic goiter, unspecified: Secondary | ICD-10-CM | POA: Insufficient documentation

## 2024-07-17 DIAGNOSIS — Z22330 Carrier of Group B streptococcus: Secondary | ICD-10-CM | POA: Insufficient documentation

## 2024-07-17 DIAGNOSIS — A6000 Herpesviral infection of urogenital system, unspecified: Secondary | ICD-10-CM | POA: Insufficient documentation

## 2024-07-17 DIAGNOSIS — E7800 Pure hypercholesterolemia, unspecified: Secondary | ICD-10-CM | POA: Insufficient documentation

## 2024-07-17 MED ORDER — NYSTATIN 100000 UNIT/ML MT SUSP: 5.0000 mL | Freq: Four times a day (QID) | OROMUCOSAL | 0 refills | Status: DC

## 2024-07-17 NOTE — Assessment & Plan Note (Signed)
 Discussed case with supervising physician who also evaluated patient. Etiology not clear, but based on history and presentation strong possibility that this is thrush vs. Side effect of recent metronidazole . Due to low risk profile we will treat with nystatin swish and spit 4 times/day x 7 days.

## 2024-07-17 NOTE — Progress Notes (Signed)
   Established Patient Office Visit  Subjective   Patient ID: Colleen Fisher, female    DOB: 11-04-1980  Age: 43 y.o. MRN: [id number]  Chief Complaint  Patient presents with   Medical Management of Chronic Issues    Buildup on tongue has been going on for about a month started a week after the antibiotics       History of Present Illness Colleen Fisher is a 43 year old female who presents with a persistent white film on her tongue and altered taste sensation.  Oral mucosal changes - Persistent faint white/orange film on the tongue - History of similar tongue buildup in the past, previously responsive to nystatin mouthwash -Recent history of multiple antibiotic use (metronidazole , augmentin, and azithromycin)  Dysgeusia (altered taste sensation) - Metallic taste when drinking water - Off-putting taste with food, with food tasting bland after a few bites        ROS: see HPI    Objective:     BP 118/78 (BP Location: Left Arm, Patient Position: Sitting, Cuff Size: Normal)   Pulse 65   Temp 98.3 F (36.8 C) (Oral)   Ht 5' 4 (1.626 m)   Wt 152 lb (68.9 kg)   LMP 07/01/2024 (Exact Date)   SpO2 98%   BMI 26.09 kg/m    Physical Exam Vitals reviewed.  Constitutional:      General: She is not in acute distress.    Appearance: Normal appearance.  HENT:     Head: Normocephalic and atraumatic.     Mouth/Throat:     Lips: Pink.     Mouth: Mucous membranes are moist.     Tongue: No lesions.     Comments: Scant white coating on tongue Cardiovascular:     Rate and Rhythm: Normal rate.     Pulses: Normal pulses.  Pulmonary:     Effort: Pulmonary effort is normal.  Skin:    General: Skin is warm and dry.  Neurological:     General: No focal deficit present.     Mental Status: She is alert and oriented to person, place, and time.  Psychiatric:        Mood and Affect: Mood normal.        Behavior: Behavior normal.        Judgment: Judgment normal.      No  results found for any visits on 07/17/24.    The ASCVD Risk score (Arnett DK, et al., 2019) failed to calculate for the following reasons:   The valid total cholesterol range is 130 to 320 mg/dL    Assessment & Plan:   Problem List Items Addressed This Visit       Digestive   Thrush - Primary   Discussed case with supervising physician who also evaluated patient. Etiology not clear, but based on history and presentation strong possibility that this is thrush vs. Side effect of recent metronidazole . Due to low risk profile we will treat with nystatin swish and spit 4 times/day x 7 days.      Relevant Medications   nystatin (MYCOSTATIN) 100000 UNIT/ML suspension   Assessment & Plan     Return if symptoms worsen or fail to improve.    Lauraine FORBES Pereyra, NP

## 2024-07-23 ENCOUNTER — Other Ambulatory Visit: Payer: Self-pay | Admitting: Nurse Practitioner

## 2024-07-23 DIAGNOSIS — B370 Candidal stomatitis: Secondary | ICD-10-CM

## 2024-07-23 MED ORDER — NYSTATIN 100000 UNIT/ML MT SUSP: 5.0000 mL | Freq: Four times a day (QID) | OROMUCOSAL | 0 refills | Status: DC

## 2024-08-12 ENCOUNTER — Ambulatory Visit: Admitting: Oncology

## 2024-08-12 ENCOUNTER — Other Ambulatory Visit

## 2024-08-13 ENCOUNTER — Inpatient Hospital Stay: Attending: Oncology

## 2024-08-13 ENCOUNTER — Inpatient Hospital Stay (HOSPITAL_BASED_OUTPATIENT_CLINIC_OR_DEPARTMENT_OTHER): Admitting: Oncology

## 2024-08-13 VITALS — BP 115/75 | HR 64 | Temp 97.9°F | Resp 13 | Ht 63.0 in | Wt 153.7 lb

## 2024-08-13 DIAGNOSIS — D500 Iron deficiency anemia secondary to blood loss (chronic): Secondary | ICD-10-CM

## 2024-08-13 DIAGNOSIS — Z79624 Long term (current) use of inhibitors of nucleotide synthesis: Secondary | ICD-10-CM | POA: Insufficient documentation

## 2024-08-13 DIAGNOSIS — D509 Iron deficiency anemia, unspecified: Secondary | ICD-10-CM | POA: Insufficient documentation

## 2024-08-13 LAB — CBC WITH DIFFERENTIAL (CANCER CENTER ONLY)
Abs Immature Granulocytes: 0.02 K/uL (ref 0.00–0.07)
Basophils Absolute: 0.1 K/uL (ref 0.0–0.1)
Basophils Relative: 1 %
Eosinophils Absolute: 0.1 K/uL (ref 0.0–0.5)
Eosinophils Relative: 2 %
HCT: 35.2 % — ABNORMAL LOW (ref 36.0–46.0)
Hemoglobin: 11.3 g/dL — ABNORMAL LOW (ref 12.0–15.0)
Immature Granulocytes: 0 %
Lymphocytes Relative: 24 %
Lymphs Abs: 1.3 K/uL (ref 0.7–4.0)
MCH: 27.4 pg (ref 26.0–34.0)
MCHC: 32.1 g/dL (ref 30.0–36.0)
MCV: 85.2 fL (ref 80.0–100.0)
Monocytes Absolute: 0.5 K/uL (ref 0.1–1.0)
Monocytes Relative: 9 %
Neutro Abs: 3.5 K/uL (ref 1.7–7.7)
Neutrophils Relative %: 64 %
Platelet Count: 222 K/uL (ref 150–400)
RBC: 4.13 MIL/uL (ref 3.87–5.11)
RDW: 16 % — ABNORMAL HIGH (ref 11.5–15.5)
WBC Count: 5.5 K/uL (ref 4.0–10.5)
nRBC: 0 % (ref 0.0–0.2)

## 2024-08-13 LAB — IRON AND IRON BINDING CAPACITY (CC-WL,HP ONLY)
Iron: 35 ug/dL (ref 28–170)
Saturation Ratios: 10 % — ABNORMAL LOW (ref 10.4–31.8)
TIBC: 358 ug/dL (ref 250–450)
UIBC: 323 ug/dL (ref 148–442)

## 2024-08-13 LAB — FERRITIN: Ferritin: 27 ng/mL (ref 11–307)

## 2024-08-13 NOTE — Progress Notes (Signed)
 [facility]  HEMATOLOGY CLINIC PROGRESS NOTE  PATIENT NAME: Colleen Fisher   MR#: [id number] DOB: Jun 01, 1981  Patient Care Team: Elnor Lauraine BRAVO, NP as PCP - General (Nurse Practitioner) Delana Ted Morrison, DO as Consulting Physician (Obstetrics and Gynecology)  Date of visit: 08/13/2024   ASSESSMENT & PLAN:   Colleen Fisher is a 43 y.o.  lady with a past medical history of herpes simplex, chronic anemia, was referred to our service in July 2025 for evaluation of iron deficiency anemia.    Iron deficiency anemia Chronic iron deficiency anemia with low hemoglobin levels, currently at 9.7 g/dL as of Feb 18, 2024, previously 9.3 g/dL in February 7974. Iron stores and ferritin are low, with ferritin at 7.1 ng/mL and iron at 14 mcg/dL.   She is taking Accrufer  (ferric maltol ) twice daily, well tolerated without constipation or heartburn.  She does admit to missing few doses and takes it when she remembers.  No symptoms of anemia such as chest pain, shortness of breath, or dizziness. Cravings for ice suggest ongoing low iron levels. No heavy menstrual cycles reported. Previous blood transfusion in 2011-2012 for severe anemia with hemoglobin at 5 g/dL.  On her consultation with us  on 04/16/2024, labs revealed hemoglobin of 9.2, MCV 76.6.  Iron studies continue to show iron deficiency with iron saturation of 3%, iron decreased at 13, ferritin borderline low at 14.  Vitamin B12 and folic acid  were normal.  I called patient with these results.   She preferred to avoid IV iron due to cost concerns.  She stated that she will be diligent in taking Accrufer  twice daily going forward.   - Continue Accrufer  (ferric maltol ) twice daily on an empty stomach, one hour before or two hours after meals.    - If hemoglobin is below 9 g/dL or iron levels remain low, consider IV iron therapy after return visit, but prioritize oral therapy due to cost concerns.   RTC in 3 months for  reevaluation.   Assessment and Plan Assessment & Plan Iron deficiency anemia, improving Iron deficiency anemia is improving with current treatment. Hemoglobin has increased from 9.2 in July to 11.3 currently. Red blood cell size has normalized, and RDW has decreased from 22 to 16, indicating more uniform cell size. Ferritin was previously low at 14, but current iron levels are pending. She is not currently anemic as hemoglobin is above the threshold for anemia (12-15). No need for IV iron at this time. - Continue current iron supplementation regimen. - Will schedule follow-up in six months with repeat blood counts and iron studies.      I spent a total of 20 minutes during this encounter with the patient including review of chart and various tests results, discussions about plan of care and coordination of care plan.  I reviewed lab results and outside records for this visit and discussed relevant results with the patient. Diagnosis, plan of care and treatment options were also discussed in detail with the patient. Opportunity provided to ask questions and answers provided to her apparent satisfaction. Provided instructions to call our clinic with any problems, questions or concerns prior to return visit. I recommended to continue follow-up with PCP and sub-specialists. She verbalized understanding and agreed with the plan. No barriers to learning was detected.  Chinita Patten, MD  08/13/2024 2:02 PM  [facility] - A DEPT OF [place] [address] [place]  [address] Dept: [phone] Dept Fax: [phone]   CHIEF COMPLAINT/ REASON FOR VISIT:  Follow-up for iron deficiency anemia, presumed to be from menorrhagia.  INTERVAL HISTORY:  Discussed the use of AI scribe software for clinical note transcription with the patient, who gave verbal consent to proceed.  History of Present Illness Colleen Fisher is a 43 year  old female with iron deficiency anemia who presents for follow-up of her anemia management.  She has been managing her iron deficiency anemia with a new iron supplement purchased from [employer], as recommended by her sister. She switched from Acryfer due to cost issues and has been taking the new supplement inconsistently, approximately once a day when she remembers. The supplement includes B12, folic acid , and vitamin C, and she feels better since starting it.  Her hemoglobin level has improved from 9.2 in July to 11.3 currently. Her red blood cell size was previously small and her red cell distribution width (RDW) was 22; currently, her RDW is 16. Her white blood cell count, which was slightly low at 3,900 in July, is now normal.  She experiences ice cravings as a symptom of low iron levels, which have subsided since starting the new supplement.   SUMMARY OF HEMATOLOGIC HISTORY:   She was referred by her OBGYN for evaluation of persistent anemia and consideration of IV iron therapy.   Labs at her OB/GYN's office on 02/18/2024 showed hemoglobin of 9.7, hematocrit 32.7, MCV 78.  White count 4600.  Platelet count normal at 221,000.  Iron labs showed decreased iron saturation of 4%, ferritin decreased at 10, iron decreased at 17.  She has been on oral iron supplements, with Accrufer  lately.  Given persistent iron deficiency anemia, referral was sent to us  for further evaluation and consideration of IV iron.   She has been experiencing anemia with a hemoglobin level of 9.7 in May, which was an increase from 9.3 in February. She has been on iron supplements, initially over-the-counter, but recently switched to a prescribed formulation, Accrufer , which she takes twice daily. No side effects with the new medication.   In the past, she has experienced nausea with over-the-counter iron supplements. She has a history of a blood transfusion in 2011 or 2012 due to severe anemia with a hemoglobin level of 5.0,  which was associated with shortness of breath. Following the transfusion, she received IV iron treatments.   No current symptoms of chest pain, shortness of breath, or dizziness. However, she has a persistent craving for ice, which she associates with low iron levels. She also feels tired, but attributes this to lack of sleep rather than anemia.   She has not experienced any significant bleeding, such as blood in stools, black stools, epistaxis, or gum bleeds. She notes occasional minor bleeding from anal fissures due to constipation. Her menstrual cycles are not heavy, and she has not had a colonoscopy yet.   Chronic iron deficiency anemia with low hemoglobin levels, currently at 9.7 g/dL as of Feb 18, 2024, previously 9.3 g/dL in February 7974. Iron stores and ferritin are low, with ferritin at 7.1 ng/mL and iron at 14 mcg/dL.    She was previously taking Accrufer  (ferric maltol ) twice daily, well tolerated without constipation or heartburn.  She does admit to missing few doses and takes it when she remembers.  No symptoms of anemia such as chest pain, shortness of breath, or dizziness. Cravings for ice suggest ongoing low iron levels. No heavy menstrual cycles reported. Previous blood transfusion in 2011-2012 for  severe anemia with hemoglobin at 5 g/dL.   On her consultation with us  on 04/16/2024, labs revealed hemoglobin of 9.2, MCV 76.6.  Iron studies continue to show iron deficiency with iron saturation of 3%, iron decreased at 13, ferritin borderline low at 14.  Vitamin B12 and folic acid  were normal.  I called patient with these results.   She preferred to avoid IV iron due to cost concerns.  She stated that she will be diligent in taking Accrufer  twice daily going forward.   - Continue Accrufer  (ferric maltol ) twice daily on an empty stomach, one hour before or two hours after meals.    - If hemoglobin is below 9 g/dL or iron levels remain low, consider IV iron therapy after return visit, but  prioritize oral therapy due to cost concerns.    I have reviewed the past medical history, past surgical history, social history and family history with the patient and they are unchanged from previous note.  ALLERGIES: She has no known allergies.  MEDICATIONS:  Current Outpatient Medications  Medication Sig Dispense Refill   gelatin absorbable (SURGIFOAM) 50 sponge Apply 1 each topically once.     ibuprofen  (ADVIL ) 600 MG tablet Take 1 tablet (600 mg total) by mouth every 8 (eight) hours as needed. 30 tablet 1   metroNIDAZOLE  (METROGEL ) 0.75 % vaginal gel Place 1 Applicatorful vaginally at bedtime.     Multiple Vitamins-Minerals (HAIR SKIN & NAILS ADVANCED PO) Take 1 tablet by mouth daily.     OVER THE COUNTER MEDICATION Take 1 tablet by mouth daily. Mega[employer] Blood Builder Iron Supplement for Women and Men. Iron 26 mg, Vitamin C 15mg  (17%), Folate 680 mcg DFE (170%), Vitamin B-12 (cyanobalamine) 30 mcg 1250%, Organic beetroot 100 mg.     valACYclovir  (VALTREX ) 500 MG tablet Take 1 tablet (500 mg total) by mouth 2 (two) times daily. 60 tablet 6   ACCRUFER  30 MG CAPS Take 1 capsule by mouth 2 (two) times daily. (Patient not taking: Reported on 08/13/2024)     No current facility-administered medications for this visit.     REVIEW OF SYSTEMS:    Review of Systems - Oncology  All other pertinent systems were reviewed with the patient and are negative.  PHYSICAL EXAMINATION:   Onc Performance Status - 08/13/24 1143       ECOG Perf Status   ECOG Perf Status Fully active, able to carry on all pre-disease performance without restriction      KPS SCALE   KPS % SCORE Normal, no compliants, no evidence of disease          Vitals:   08/13/24 1141 08/13/24 1142  BP: 115/75 115/75  Pulse: 64 64  Resp: 13 13  Temp: 97.9 F (36.6 C) 97.9 F (36.6 C)  SpO2: 100% 100%   Filed Weights   08/13/24 1141 08/13/24 1142  Weight: 153 lb 11.2 oz (69.7 kg) 153 lb 11.2 oz (69.7 kg)     Physical Exam Constitutional:      General: She is not in acute distress.    Appearance: Normal appearance.  HENT:     Head: Normocephalic and atraumatic.  Cardiovascular:     Rate and Rhythm: Normal rate.  Pulmonary:     Effort: Pulmonary effort is normal. No respiratory distress.  Abdominal:     General: There is no distension.  Neurological:     General: No focal deficit present.     Mental Status: She is alert and oriented to person,  place, and time.  Psychiatric:        Mood and Affect: Mood normal.        Behavior: Behavior normal.     LABORATORY DATA:   I have reviewed the data as listed.  Results for orders placed or performed in visit on 08/13/24  Ferritin  Result Value Ref Range   Ferritin 27 11 - 307 ng/mL  Iron and Iron Binding Capacity ([facility] only)  Result Value Ref Range   Iron 35 28 - 170 ug/dL   TIBC 641 749 - 549 ug/dL   Saturation Ratios 10 (L) 10.4 - 31.8 %   UIBC 323 148 - 442 ug/dL  CBC with Differential (Cancer Center Only)  Result Value Ref Range   WBC Count 5.5 4.0 - 10.5 K/uL   RBC 4.13 3.87 - 5.11 MIL/uL   Hemoglobin 11.3 (L) 12.0 - 15.0 g/dL   HCT 64.7 (L) 63.9 - 53.9 %   MCV 85.2 80.0 - 100.0 fL   MCH 27.4 26.0 - 34.0 pg   MCHC 32.1 30.0 - 36.0 g/dL   RDW 83.9 (H) 88.4 - 84.4 %   Platelet Count 222 150 - 400 K/uL   nRBC 0.0 0.0 - 0.2 %   Neutrophils Relative % 64 %   Neutro Abs 3.5 1.7 - 7.7 K/uL   Lymphocytes Relative 24 %   Lymphs Abs 1.3 0.7 - 4.0 K/uL   Monocytes Relative 9 %   Monocytes Absolute 0.5 0.1 - 1.0 K/uL   Eosinophils Relative 2 %   Eosinophils Absolute 0.1 0.0 - 0.5 K/uL   Basophils Relative 1 %   Basophils Absolute 0.1 0.0 - 0.1 K/uL   Immature Granulocytes 0 %   Abs Immature Granulocytes 0.02 0.00 - 0.07 K/uL    RADIOGRAPHIC STUDIES:  No recent pertinent imaging studies available to review.  Orders Placed This Encounter  Procedures   CBC with Differential (Cancer Center Only)    Standing Status:    Future    Expected Date:   02/10/2025    Expiration Date:   05/11/2025   Iron and Iron Binding Capacity ([facility] only)    Standing Status:   Future    Expected Date:   02/10/2025    Expiration Date:   05/11/2025   Ferritin    Standing Status:   Future    Expected Date:   02/10/2025    Expiration Date:   05/11/2025     Future Appointments  Date Time Provider Department Center  02/16/2025  2:00 PM [facility] PHLEBOTOMIST [facility] None  02/16/2025  2:30 PM Maliya Marich, Chinita, MD [facility] None     This document was completed utilizing speech recognition software. Grammatical errors, random word insertions, pronoun errors, and incomplete sentences are an occasional consequence of this system due to software limitations, ambient noise, and hardware issues. Any formal questions or concerns about the content, text or information contained within the body of this dictation should be directly addressed to the provider for clarification.

## 2024-08-13 NOTE — Assessment & Plan Note (Signed)
 Chronic iron deficiency anemia with low hemoglobin levels, currently at 9.7 g/dL as of Feb 18, 2024, previously 9.3 g/dL in February 7974. Iron stores and ferritin are low, with ferritin at 7.1 ng/mL and iron at 14 mcg/dL.   She is taking Accrufer  (ferric maltol ) twice daily, well tolerated without constipation or heartburn.  She does admit to missing few doses and takes it when she remembers.  No symptoms of anemia such as chest pain, shortness of breath, or dizziness. Cravings for ice suggest ongoing low iron levels. No heavy menstrual cycles reported. Previous blood transfusion in 2011-2012 for severe anemia with hemoglobin at 5 g/dL.  On her consultation with us  on 04/16/2024, labs revealed hemoglobin of 9.2, MCV 76.6.  Iron studies continue to show iron deficiency with iron saturation of 3%, iron decreased at 13, ferritin borderline low at 14.  Vitamin B12 and folic acid  were normal.  I called patient with these results.   She preferred to avoid IV iron due to cost concerns.  She stated that she will be diligent in taking Accrufer  twice daily going forward.   - Continue Accrufer  (ferric maltol ) twice daily on an empty stomach, one hour before or two hours after meals.    - If hemoglobin is below 9 g/dL or iron levels remain low, consider IV iron therapy after return visit, but prioritize oral therapy due to cost concerns.   RTC in 3 months for reevaluation.

## 2024-08-14 ENCOUNTER — Encounter: Payer: Self-pay | Admitting: Oncology

## 2025-02-16 ENCOUNTER — Inpatient Hospital Stay

## 2025-02-16 ENCOUNTER — Inpatient Hospital Stay: Admitting: Oncology
